# Patient Record
Sex: Female | Born: 1975 | Race: White | Hispanic: No | Marital: Married | Smoking: Never smoker
Health system: Southern US, Community
[De-identification: ages and names within clinical notes are randomized; demographics above are authoritative.]

## PROBLEM LIST (undated history)

## (undated) DIAGNOSIS — E785 Hyperlipidemia, unspecified: Secondary | ICD-10-CM

## (undated) DIAGNOSIS — I10 Essential (primary) hypertension: Secondary | ICD-10-CM

## (undated) HISTORY — DX: Essential (primary) hypertension: I10

## (undated) HISTORY — PX: ROTATOR CUFF REPAIR: SHX139

## (undated) HISTORY — PX: LAPAROSCOPY: SHX197

## (undated) HISTORY — DX: Hyperlipidemia, unspecified: E78.5

## (undated) HISTORY — PX: NECK SURGERY: SHX720

---

## 1998-01-04 ENCOUNTER — Emergency Department (HOSPITAL_COMMUNITY): Admission: EM | Admit: 1998-01-04 | Discharge: 1998-01-04 | Payer: Self-pay | Admitting: Emergency Medicine

## 1998-11-12 ENCOUNTER — Emergency Department (HOSPITAL_COMMUNITY): Admission: EM | Admit: 1998-11-12 | Discharge: 1998-11-12 | Payer: Self-pay | Admitting: Emergency Medicine

## 2000-12-04 ENCOUNTER — Ambulatory Visit (HOSPITAL_COMMUNITY): Admission: RE | Admit: 2000-12-04 | Discharge: 2000-12-04 | Payer: Self-pay | Admitting: Obstetrics and Gynecology

## 2000-12-04 ENCOUNTER — Encounter (INDEPENDENT_AMBULATORY_CARE_PROVIDER_SITE_OTHER): Payer: Self-pay

## 2002-09-26 ENCOUNTER — Other Ambulatory Visit: Admission: RE | Admit: 2002-09-26 | Discharge: 2002-09-26 | Payer: Self-pay | Admitting: Obstetrics and Gynecology

## 2003-09-27 ENCOUNTER — Other Ambulatory Visit: Admission: RE | Admit: 2003-09-27 | Discharge: 2003-09-27 | Payer: Self-pay | Admitting: Obstetrics and Gynecology

## 2004-10-14 ENCOUNTER — Ambulatory Visit (HOSPITAL_COMMUNITY): Admission: RE | Admit: 2004-10-14 | Discharge: 2004-10-14 | Payer: Self-pay | Admitting: Obstetrics and Gynecology

## 2004-10-14 ENCOUNTER — Encounter (INDEPENDENT_AMBULATORY_CARE_PROVIDER_SITE_OTHER): Payer: Self-pay | Admitting: Specialist

## 2006-01-20 ENCOUNTER — Emergency Department (HOSPITAL_COMMUNITY): Admission: EM | Admit: 2006-01-20 | Discharge: 2006-01-20 | Payer: Self-pay | Admitting: Emergency Medicine

## 2007-10-26 ENCOUNTER — Ambulatory Visit: Payer: Self-pay | Admitting: Vascular Surgery

## 2008-09-19 ENCOUNTER — Ambulatory Visit (HOSPITAL_COMMUNITY): Admission: RE | Admit: 2008-09-19 | Discharge: 2008-09-19 | Payer: Self-pay | Admitting: Family Medicine

## 2009-01-15 ENCOUNTER — Ambulatory Visit (HOSPITAL_COMMUNITY): Admission: RE | Admit: 2009-01-15 | Discharge: 2009-01-15 | Payer: Self-pay | Admitting: Obstetrics

## 2010-04-18 ENCOUNTER — Encounter (HOSPITAL_COMMUNITY)
Admission: RE | Admit: 2010-04-18 | Discharge: 2010-04-18 | Disposition: A | Discharge status: Home / Self Care | Payer: BC Managed Care – PPO | Source: Ambulatory Visit | Attending: Obstetrics | Admitting: Obstetrics

## 2010-04-18 DIAGNOSIS — Z01812 Encounter for preprocedural laboratory examination: Secondary | ICD-10-CM | POA: Insufficient documentation

## 2010-04-18 LAB — URINALYSIS, ROUTINE W REFLEX MICROSCOPIC
Bilirubin Urine: NEGATIVE
Ketones, ur: NEGATIVE mg/dL
Nitrite: NEGATIVE
Specific Gravity, Urine: <1.005 — ABNORMAL LOW (ref 1.005–1.030)
Urobilinogen, UA: 0.2 mg/dL (ref 0.0–1.0)

## 2010-04-18 LAB — RPR: RPR Ser Ql: NONREACTIVE

## 2010-04-18 LAB — CBC
HCT: 39.3 % (ref 36.0–46.0)
Hemoglobin: 12.7 g/dL (ref 12.0–15.0)
MCH: 29 pg (ref 26.0–34.0)
MCHC: 32.3 g/dL (ref 30.0–36.0)
MCV: 89.7 fL (ref 78.0–100.0)
Platelets: 196 10*3/uL (ref 150–400)
RBC: 4.38 MIL/uL (ref 3.87–5.11)
RDW: 13.3 % (ref 11.5–15.5)
WBC: 7.5 10*3/uL (ref 4.0–10.5)

## 2010-04-18 LAB — URINE MICROSCOPIC-ADD ON

## 2010-04-25 ENCOUNTER — Inpatient Hospital Stay (HOSPITAL_COMMUNITY)
Admission: RE | Admit: 2010-04-25 | Discharge: 2010-04-28 | DRG: 371 | Disposition: A | Discharge status: Home / Self Care | Payer: BC Managed Care – PPO | Source: Ambulatory Visit | Attending: Obstetrics | Admitting: Obstetrics

## 2010-04-25 DIAGNOSIS — O09519 Supervision of elderly primigravida, unspecified trimester: Secondary | ICD-10-CM | POA: Diagnosis present

## 2010-04-25 DIAGNOSIS — O321XX0 Maternal care for breech presentation, not applicable or unspecified: Principal | ICD-10-CM | POA: Diagnosis present

## 2010-04-25 LAB — ABO/RH: ABO/RH(D): A NEG

## 2010-04-25 LAB — TYPE AND SCREEN: ABO/RH(D): A NEG

## 2010-04-26 LAB — CBC
MCHC: 32.2 g/dL (ref 30.0–36.0)
RDW: 13.4 % (ref 11.5–15.5)

## 2010-04-30 ENCOUNTER — Inpatient Hospital Stay (HOSPITAL_COMMUNITY)
Admission: AD | Admit: 2010-04-30 | Discharge: 2010-04-30 | Disposition: A | Discharge status: Home / Self Care | Payer: BC Managed Care – PPO | Source: Ambulatory Visit | Attending: Obstetrics & Gynecology | Admitting: Obstetrics & Gynecology

## 2010-04-30 DIAGNOSIS — O139 Gestational [pregnancy-induced] hypertension without significant proteinuria, unspecified trimester: Secondary | ICD-10-CM | POA: Insufficient documentation

## 2010-04-30 LAB — COMPREHENSIVE METABOLIC PANEL
Alkaline Phosphatase: 117 U/L (ref 39–117)
BUN: 10 mg/dL (ref 6–23)
GFR calc non Af Amer: >60 mL/min (ref >60)
Glucose, Bld: 95 mg/dL (ref 70–99)
Potassium: 4 mEq/L (ref 3.5–5.1)
Total Protein: 6.9 g/dL (ref 6.0–8.3)

## 2010-04-30 LAB — URINALYSIS, ROUTINE W REFLEX MICROSCOPIC
Nitrite: NEGATIVE
Specific Gravity, Urine: <1.005 — ABNORMAL LOW (ref 1.005–1.030)
pH: 6 (ref 5.0–8.0)

## 2010-04-30 LAB — URIC ACID: Uric Acid, Serum: 4.6 mg/dL (ref 2.4–7.0)

## 2010-04-30 LAB — CBC
Platelets: 242 10*3/uL (ref 150–400)
RBC: 3.97 MIL/uL (ref 3.87–5.11)
WBC: 7.4 10*3/uL (ref 4.0–10.5)

## 2010-04-30 LAB — URINE MICROSCOPIC-ADD ON

## 2010-04-30 LAB — LACTATE DEHYDROGENASE: LDH: 194 U/L (ref 94–250)

## 2010-05-02 ENCOUNTER — Inpatient Hospital Stay (HOSPITAL_COMMUNITY): Admission: AD | Admit: 2010-05-02 | Payer: Self-pay | Admitting: Obstetrics

## 2010-05-10 NOTE — Op Note (Signed)
Veronica Armstrong, Veronica Armstrong                ACCOUNT NO.:  [id number]  MEDICAL RECORD NO.:  [id number]           PATIENT TYPE:  I  LOCATION:  9145                          FACILITY:  [facility]  PHYSICIAN:  Lendon Colonel, MD   DATE OF BIRTH:  08-Jun-1975  DATE OF PROCEDURE:  04/25/2010 DATE OF DISCHARGE:  04/18/2010                              OPERATIVE REPORT   PREOPERATIVE DIAGNOSES: 1. Persistent breech presentation. 2. 39 weeks' intrauterine pregnancy.  POSTOPERATIVE DIAGNOSES: 1. Persistent breech presentation. 2. 39 weeks' intrauterine pregnancy.  PROCEDURE:  Primary low transverse cesarean section with 2-layer closure.  SURGEON:  Alphonsus Sias. Ernestina Penna, MD  ASSISTANT:  Lenoard Aden, MD  ANESTHESIA:  Spinal.  FINDINGS:  Baby girl in the complete breech presentation, left sacrum transverse.  Apgars 9 and 9, weight 7 pounds 0 ounces.  Normal uterus, tubes, and ovaries.  Normal placenta.  Clear amniotic fluid, Apgars 9 and 9.ESTIMATED BLOOD LOSS:  500 mL.  ANTIBIOTICS:  1 g of Ancef.  COMPLICATIONS:  None.  INDICATIONS:  This a 35 year old G1 at 5 weeks and 0 days' gestation with persistent breech presentation, who declined trial of version, presents for primary cesarean section.  PROCEDURE:  After informed consent was obtained, the patient was taken to the operating room where spinal anesthesia was administered without difficulty.  She was prepped and draped in the normal sterile fashion in dorsal supine with leftward tilt.  A Pfannenstiel skin incision was made 2 cm above the pubic symphysis in midline, carried through to the underlying layer of fascia with the Bovie cautery.  The fascia was incised in the midline and the incision extended sharply.  The inferior aspect of fascial incision was grasped with the Kocher clamps, elevated up, and the underlying rectus muscles dissected off sharply.  The superior aspect of the fascial incision was grasped with the Kocher clamps,  elevated up, and the underlying rectus muscles dissected off with the Bovie.  The pyramidalis muscles were separated with the Bovie cautery in the midline.  The rectus muscles were bluntly separated.  The peritoneum was identified and entered bluntly.  Peritoneal incision was extended superiorly and inferiorly with good visualization of the bladder.  Palpation of the abdomen confirmed fetal position, and the ultrasound will be done perioperatively.  The bladder was inserted.  The vesicouterine peritoneum was identified, grasped with pickups, entered sharply, extended laterally, and the bladder flap was created digitally. The bladder blade was reinserted.  The lower uterine segment was incised in a transverse fashion with a scalpel.  The uterine incision was extended with the bandage scissors.  The infant's sacrum was grasped, brought through the incision.  The left and right legs were delivered. The right shoulder was delivered.  The baby was rotated.  The left shoulder and arm was delivered and the head was delivered in flexion. Bulb suctioning was done.  The cord was clamped and cut.  Infant was handed off to awaiting pediatrician.  The placenta was expressed.  Cord blood was obtained.  The uterus was exteriorized and cleared of all clots and debris.  Uterine incision was repaired with  an 0 Vicryl in a running locked fashion.  A second layer of same suture was used in imbricating fashion to obtain good hemostasis.  The uterus was returned to the abdomen.  The incision was reinspected.  The tagged angles were cut.  The bladder flap was inspected.  Several capillary bleeders were noted and Bovie cautery was used for hemostasis.  The gutters were cleared of all clots and debris.  Incision was reinspected and found to be hemostatic.  The peritoneum was closed with a 2-0 Vicryl in a running fashion.  The cut muscle edges on the underside of fascia were inspected, found to be hemostatic.  The  fascia was closed with an 0 Vicryl in a running fashion in a single layer.  The single stitch was placed to reapproximate the rectus muscles over the bladder prior to closure of the fascia.  2-0 plain suture was used to reapproximate Scarpa's layer and the skin was closed with staples.  The patient tolerated the procedure well.  Sponge, lap, and needle counts were correct x3 and the patient was taken to recovery room in stable condition.     Lendon Colonel, MD     KAF/MEDQ  D:  04/25/2010  T:  04/26/2010  Job:  [id number]  Electronically Signed by Noland Fordyce MD on 05/09/2010 07:56:32 PM

## 2010-05-10 NOTE — Discharge Summary (Signed)
  NAMEJENNAVECIA, Veronica Armstrong                ACCOUNT NO.:  [id number]  MEDICAL RECORD NO.:  [id number]           PATIENT TYPE:  I  LOCATION:  9145                          [facility]ILITY:  [facility]  PHYSICIAN:  Lendon Colonel, MD   DATE OF BIRTH:  [birth year]  DATE OF ADMISSION:  04/25/2010 DATE OF DISCHARGE:  04/28/2010                              DISCHARGE SUMMARY   ADMITTING DIAGNOSIS:  62 weeks' gestation with breech presentation.  DISCHARGE DIAGNOSIS:  Postoperative day #3, status post cesarean section.  PATIENT HISTORY:  The patient is a 35 year old gravida 1, para 0 at 34 weeks' gestation.  Prenatal care has been at [facility] since 6 weeks' gestation with Dr. Ernestina Penna as primary.  MEDICAL HISTORY:  History of Raynaud, depression, anxiety, and allergic rhinitis.  ALLERGIES:  The patient has known allergies to ERYTHROMYCIN, PHENERGAN, VICODIN, and LATEX.  CURRENT MEDICATIONS AT TIME OF ADMISSION: 1. Flonase. 2. Zantac 150 mg p.o. daily. 3. Claritin 10 mg p.o. daily. 4. Wellbutrin 150 mg p.o. daily.  SURGICAL HISTORY:  Significant for laparoscopy x2.  PRENATAL LABS:  Blood type and Rh is A negative, rubella positive, HIV negative, RPR negative, hepatitis B negative, GTT screening within normal limits, negative gonorrhea, and negative Chlamydia, history of a positive ANA titer.  PRESENTATION:  The patient presents for scheduled cesarean section for breech presentation.  Preop CBC, white blood cell count 7.5, hemoglobin 12.7, hematocrit 39.3, and platelet count of 196.  Postop care, the patient's postoperative course was uneventful.  The patient has labile blood pressures with increased pain related to inadequate pain control. On postoperative day #1-1/2 to 2, pain was well controlled on postoperative day #2 with 2 Percocet p.o. q.4 h.  Physical exam was within normal limits.  Vital signs were stable.  The patient remains afebrile during the course of hospitalization.   Blood pressures 126-160/75-96, negative output, 785 mL.  PROCEDURE:  The patient delivered a female by cesarean section with Dr. Ernestina Penna, 7 pounds 0 ounces, Apgar scores of 9 and 9, see operative report for further details.  DISCHARGE INSTRUCTIONS:  The patient discharged home on postoperative day #3 in stable condition.  The patient returned to [facility] within 2 days for blood pressure recheck and staple removal.  MEDICATIONS AT TIME OF DISCHARGE: 1. Prenatal vitamin 1 tablet p.o. daily. 2. Flonase. 3. Zantac 150 mg p.o. daily. 4. Claritin 10 mg p.o. daily. 5. Wellbutrin 150 mg p.o. daily. 6. Ibuprofen 800 mg every 8 hours as needed for discomfort. 7. Percocet 2 tablets every 4 hours as needed for pain.     Marlinda Mike, C.N.M.   ______________________________ Lendon Colonel, MD    TB/MEDQ  D:  04/28/2010  T:  04/29/2010  Job:  [id number]  Electronically Signed by Marlinda Mike C.N.M. on 05/06/2010 12:32:50 PM Electronically Signed by Noland Fordyce MD on 05/09/2010 07:56:56 PM

## 2010-07-19 NOTE — H&P (Signed)
Veronica Armstrong, Veronica Armstrong                ACCOUNT NO.:  [id number]   MEDICAL RECORD NO.:  [id number]          PATIENT TYPE:  AMB   LOCATION:  SDC                           [facility]ILITY:  [facility]   PHYSICIAN:  Richardean Sale, M.D.   DATE OF BIRTH:  December 05, 1975   DATE OF ADMISSION:  10/14/2004  DATE OF DISCHARGE:                                HISTORY & PHYSICAL   PREOPERATIVE DIAGNOSIS:  1.  Pelvic pain.  2.  Endometriosis.  3.  Infertility.   HISTORY OF PRESENT ILLNESS:  This is a 35 year old gravida 0, white female  who is having progressively worsening pelvic pain, dysmenorrhea, and  dyspareunia.  The patient has a known history of endometriosis diagnosed on  laparoscopy in October of 2002.  The patient had significant improvement in  her symptoms immediately following her surgery.  Attempts were made to  control her pain with oral contraceptive pills, but were unsuccessful due to  side effects.  The patient had a Mirena IUD placed and had significant  improvement in her symptoms.  She discontinued this method one year ago in  attempts to achieve her pregnancy.  The patient and her husband have been  unsuccessful in achieving pregnancy in the last year and the patient's  symptoms have worsened substantially.  She presents today for laparoscopy  with removal and/or ablation of endometriosis and placement of a Mirena  intrauterine system.   PAST MEDICAL HISTORY:  Depression, Raynaud's phenomenon.   PAST SURGICAL HISTORY:  Laparoscopy in October of 2002 for endometriosis.   PAST OBSTETRICAL HISTORY:  Gravida 0.   GYNECOLOGIC HISTORY:  No abnormal Pap smears or STD's, positive for  endometriosis.   FAMILY HISTORY:  Father has hypertension, coronary artery disease, and  Parkinson's.  No known breast, uterine, ovarian, or colon cancer.   SOCIAL HISTORY:  She is married.  Denies tobacco or illicit drug use.  Drinks alcohol only socially.  Works for [employer].  Husband works for  the Programmer, systems.   MEDICATIONS:  1.  Advil p.r.n.  2.  Prenatal vitamins.  3.  Differin gel.   ALLERGIES:  PHENERGAN, ERYTHROMYCIN.   PHYSICAL EXAMINATION:  VITAL SIGNS:  Height 5 feet 2-3/4 inches, weight 133  pounds, blood pressure 122/80.  GENERAL:  She is a well-developed, well-nourished, white female who is in no  acute distress.  NECK:  Neck and thyroid are within normal limits without masses.  HEART:  Regular rate and rhythm.  LUNGS: Clear to auscultation bilaterally.  ABDOMEN:  Soft, nontender, and nondistended with no palpable masses.  The  liver and spleen are normal.  BREASTS:  Symmetric and nontender.  No palpable masses.  No lymphadenopathy.  No nipple discharge.  No skin changes.  PELVIC:  External genitalia are normal.  Vagina is pink, moist, and rugated.  Cervix is visibly normal without lesions.  No cervical motion tenderness.  Bimanual examination; the uterus is difficult to palpate, the patient is  moderately tender to palpation on both lower quadrants, the left greater  than right. The adnexa are difficult to palpate due to discomfort.  Urethra,  urethral meatus, bladder, and anus are all within normal limits.  Ultrasound  performed on October 03, 2004, shows a normal size uterus with a normal  appearing endometrium.  Both ovaries appear within normal limits without  masses. Last Pap smear in July of 2006 was within normal limits.   ASSESSMENT:  A 35 year old gravida 0, white female with significant pelvic  pain, dysmenorrhea, and dyspareunia with a known history of endometriosis,  now with recurrent symptoms, who no longer desires attempt at pregnancy.   PLAN:  1.  Will proceed with diagnostic laparoscopy and with removal and/or      ablation of endometriosis if endometriosis identified.  2.  Will also perform chromopertubation given the patient's history of      endometriosis and desire for pregnancy in the future.  3.  Given the patient's desire for  contraception at this time as well as      control of her endometriosis symptoms, will place Mirena intrauterine      system as well.   The risks, benefits, and alternatives of these procedures have been reviewed  with the patient in detail.  We have discussed the risks of hemorrhage,  infection, injury to the bowel, bladder, ureters, ovaries, tubes, or the  uterus which may require additional surgery either at the time of this  procedure or in the future.  We discussed the risks of anesthesia including  deep venous thrombosis, pulmonary embolism, and discussed the risks of  uterine perforation with the Mirena IUD which would require surgical removal  either at the time of this procedure or in the future.  We also discussed  general surgical risks.  The patient voices understanding of all of these  risks and desires to proceed.  Informed consent has been obtained and all  questions have been answered in detail.       JW/MEDQ  D:  10/13/2004  T:  10/13/2004  Job:  [id number]

## 2010-07-19 NOTE — H&P (Signed)
[facility]  Patient:    Veronica, Armstrong Visit Number: [id number] MRN: [id number]          Service Type: Attending:  Janine Armstrong, M.D. Dictated by:   Veronica Armstrong, M.D. Adm. Date:  12/04/00   CC:         Veronica Armstrong, M.D.   History and Physical  DATE OF BIRTH:                09-Jan-1976.  HISTORY OF PRESENT ILLNESS:   Veronica Armstrong is a 35 year old female, gravida 0, who presents for diagnostic laparoscopy because of pelvic pain and dyspareunia.  She also complains of painful bowel movements with her menses. The patient has had a negative gonorrhea and Chlamydia culture.  Her urine culture was negative.  An ultrasound demonstrated normal female organs.  Her Pap smear was within normal limits.  The patient currently takes birth control pills and this has not relieved her discomfort.  She is ready to proceed with diagnostic surgery.  DRUG ALLERGIES:               PHENERGAN and ERYTHROMYCIN.  PAST MEDICAL HISTORY:         The patient has seasonal allergies and she takes Careers adviser.  She also takes Prozac 20 mg each day for migraine headaches.  She uses Midrin as needed.  She takes Yasmin oral contraceptives.  SOCIAL HISTORY:               The patient is married and she works as a Diplomatic Services operational officer at [employer].  She denies cigarette use, alcohol use, and recreational drug use.  REVIEW OF SYSTEMS:            Please see history of present illness.  FAMILY HISTORY:               The patients father has heart disease and Parkinsons disease.  Her paternal grandmother has leukemia.  PHYSICAL EXAMINATION:  VITAL SIGNS:                  Weight 137 pounds.  HEENT:                        Within normal limits.  CHEST:                        Clear.  HEART:                        Regular rate and rhythm.  BREASTS:                      Without masses.  ABDOMEN:                      Not tender.  EXTREMITIES:                  Within  normal limits.  NEUROLOGIC EXAM:              Within normal limits.  PELVIC EXAM:                  External genitalia is normal.  The vagina is normal.  Cervix is nontender.  Uterus is normal size, shape, and consistency. Adnexa with no masses.  Rectovaginal exam confirms.  ASSESSMENT:  1.  Pelvic pain.                               2.  Dyspareunia.                               3.  Painful bowel movements.  PLAN:                         The patient will undergo a diagnostic laparoscopy and chromopertubation.  The patient understands the indications for her procedure and she accepts the risks of, but limited to, anesthetic complications, bleeding, infection, and possible damage to the surrounding organs. Dictated by:   Veronica Armstrong, M.D. Attending:  Janine Armstrong, M.D. DD:  12/03/00 TD:  12/03/00 Job: [id number]

## 2010-07-19 NOTE — Op Note (Signed)
NAMESYNAI, PRETTYMAN                ACCOUNT NO.:  [id number]   MEDICAL RECORD NO.:  [id number]          PATIENT TYPE:  AMB   LOCATION:  SDC                           FACILITY:  [facility]   PHYSICIAN:  Richardean Sale, M.D.   DATE OF BIRTH:  Dec 28, 1975   DATE OF PROCEDURE:  10/14/2004  DATE OF DISCHARGE:                                 OPERATIVE REPORT   PREOPERATIVE DIAGNOSES:  1.  Pelvic pain.  2.  Endometriosis.  3.  Infertility.   POSTOPERATIVE DIAGNOSES:  1.  Pelvic pain.  2.  Endometriosis.  3.  Infertility.  4.  Hemorrhagic left ovarian cyst.   PROCEDURE:  Laparoscopy with ablation of endometriosis and aspiration of  hemorrhagic ovarian cyst and placement of Mirena IUD.   SURGEON:  Dr. Richardean Sale   ASSISTANT:  None.   ANESTHESIA:  General.   COMPLICATIONS:  None.   ESTIMATED BLOOD LOSS:  Minimal.   SPECIMENS:  Hemorrhagic cyst the fluid to pathology.   FINDINGS:  Hemorrhagic left ovarian cyst approximately 3.5 cm in maximum  diameter.  Normal-appearing fallopian tubes bilaterally.  Right ovary  grossly normal with endometriosis implants on the posterior aspect.  Diffuse  endometriosis involving the anterior cul-de-sac, posterior cul-de-sac. both  ovaries, and the sigmoid colon and rectum.  Normal-appearing appendix;  normal liver, gallbladder, normal-appearing uterus.  Uterine sound length  8.5 cm.   INDICATIONS:  This is a 35 year old, gravida 0 white female, who has known  endometriosis documented on laparoscopy in 2002, who presents with a 1-year  history of progressively worsening pelvic pain, dysmenorrhea, and  dyspareunia.  The patient initially underwent ablation of endometriosis in  2002 followed by attempts at oral contraceptive pills.  The patient was  unable to tolerate these due the side effects.  She declined treatment with  Depot Lupron.  She has subsequently underwent insertion of a Mirena IUD and  had significant pain relief after her surgery and  after placement of the  IUD.  The IUD was discontinued 1 year ago, and the patient and her husband  have been trying to conceive without success.  The patient's pain has  progressed to the point where she no longer desires attempts at childbearing  at present, and her pain is debilitating to her, particularly during menses.  In the past week, she has had progressively worsening left lower quadrant  pain.  She presents today for diagnostic laparoscopy, possible ablation  and/or excision of endometriosis, and placement of a Mirena IUD.  Prior to  the procedure, the risks, benefits, and alternatives of all procedures were  reviewed with the patient in detail.  We discussed the risk of hemorrhage,  transfusion, infection, injury to the bowel, the bladder, the ureters, the  ovaries, the tubes, the uterus which could require additional surgery either  at the time of this procedure or in the future.  We also discussed the risk  of anesthesia and DVT, and we discussed the risks of uterine perforation  during placement of the Mirena IUD insertion and the possibility of having  to have the IUD removed surgically  at some time in the future.  The patient  voiced understanding of all these risks prior to the procedure, and informed  consent was obtained.   PROCEDURE:  The patient was taken to the operating room where she was given  a general anesthetic.  She was then prepped and draped in the usual sterile  fashion with Betadine.  A bimanual exam was performed which revealed a  normal-sized uterus.  It was anteverted and mobile.  The right adnexa was  normal.  The left adnexa felt slightly enlarged.  There was no cul-de-sac  nodularity noted.  A Foley catheter was then used to drain the bladder.  A  speculum was placed in the vagina, and the cervix was grasped with single-  tooth tenaculum.  The Hulka tenaculum was then introduced and the single-  tooth tenaculum removed.  The speculum was removed as  well.  At this point  attention was turned to the patient's abdomen.  After sterile gown and glove  were changed, 5 mL of 0.5% plain Marcaine were injected into the  infraumbilical area, and a linear incision was made with the scalpel through  the patient's previous scar.  This was carried down sharply to the fascia.  The fascia was then grasped between two Kocher clamps and entered sharply  with the Mayo scissors.  The peritoneum was then identified, grasped with a  hemostat, elevated, and entered sharply.  The fascial incision was then  secured with a pursestring Vicryl suture, and the Hassan trocar and sleeve  were introduced.  Intraabdominal placement was confirmed with the  laparoscope, and CO2 gas was allowed to insufflate.  At this point, a second  incision was made in the patient's left lower quadrant.  This was a 5 mm  incision, and 2 mL of 0.5% plain Marcaine were injected prior to the  incision.  The left 5 mm port trocar and sleeve were introduced under direct  insertion, and an atraumatic grasper was then introduced into the pelvis.  The pelvis was inspected with the findings noted above, particularly there  was a left ovarian cyst present that was not seen on the patient's  ultrasound, approximately 2-1/2 weeks ago.  This appeared hemorrhagic.  However, it was adherent to the sidewall, but these adhesions were taken  down with just blunt dissection and manipulation of the cyst.  Both  fallopian tubes appeared normal with no clubbing.  There was significant  involvement of endometriosis along the rectosigmoid; however, it did not  appear as though the lumen was at all compromised.  There were endometrial  implants over the patient's left ureter, along both uterosacral ligaments,  and about the posterior cul-de-sac.  There were scant implants noted in the  anterior cul-de-sac, and the majority of the disease was present at the posterior cul-de-sac.  At this point, a third  incision was made.  Again, 2  mL of 0.5% plain Marcaine were injected in the patient's right lower  quadrant.  The 5 mm skin incision was then made, and the trocar and sleeve  were introduced under direct visualization free from the epigastric vessels.  At this point, the ovarian cyst was stabilized.  A needle aspirator was then  inserted directly into the cyst, and hemorrhagic fluid was removed.  This  was sent to pathology labeled as cyst contents.  There was no chocolate  fluid noted and no excrescences or papillation noted along the inside of the  ovary.  Again, the cyst was  consistent with a hemorrhage cyst.  At this  point, attention was then turned to the endometrial implants.  The bipolar  cautery was used to cauterize these implants, and as many implants as could  be safely cauterized were treated in this fashion.  The right ureter was  noted to be coursing and peristalsing normally.  There were no implants over  the patient's right ureter.  On the left side, however, there was  involvement of the ureter, and this implant was not removed given its  location.  On inspection of the sigmoid colon, the implants did not appear  to be impinging upon the lumen and given that the patient had not had any  type of bowel preparation and was not prepared for any bowel surgery should  it occur, the two implants on the sigmoid colon were not cauterized.  At  this point, inspection of the pelvis revealed that all areas were  hemostatic.  The pelvis was then copiously irrigated with warm normal  saline, and the irrigant fluid was aspirated.  At this point, 5 mL of 0.5%  plain Marcaine were inserted into the pelvis and placed over the ablated  endometrial implant for additional postoperative relief.  At this point, the  laparoscopic portion of the procedure was terminated.  All instruments were  removed.  The 5 mm trocars were removed under direct visualization.  There  was no bleeding from the  trocar site.  The camera was then removed and CO2  gas was allowed to escape.  The Hasson trocar was removed.  The fascial  incision was visualized.  There was no evidence of any bowel looped up into  the fascial incision, and the fascia was then closed by tying the previously  placed pursestring suture.  The skin incisions were then all closed with 4-0  Vicryl in a subcuticular fashion, and Steri-Strips were applied.  At this  point, attention was then turned to the placement of the Mirena.  The Hulka  tenaculum was removed.  A speculum was placed.  The cervix had already been  prepped sterilely with Betadine.  A single-tooth tenaculum was then used to  grasp the anterior lip of the cervix and the uterus sounded to 8.5 cm.  A  Hegar dilator had to be used to dilate the internal cervical os.  Once this  was accomplished, the Mirena IUD was inserted without difficulty.  The  strings were trimmed to 2.5 cm in length, and the single-tooth tenaculum was removed.  The speculum was then removed.  The patient tolerated all  procedures well with no complications.  She was taken out of the dorsal  lithotomy position and was awakened from her anesthesia and was transferred  to the recovery room awake and in stable condition.  There were no  complications.      Richardean Sale, M.D.  Electronically Signed     JW/[id number]  D:  10/14/2004  T:  10/14/2004  Job:  [id number]

## 2010-07-19 NOTE — Op Note (Signed)
[facility]  Patient:    Veronica Armstrong, Veronica Armstrong Visit Number: [id number] MRN: [id number]          Service Type: DSU Location: [facility] Attending Physician:  Leonard Schwartz Dictated by:   Janine Limbo, M.D. Proc. Date: 12/04/00 Admit Date:  12/04/2000 Discharge Date: 12/04/2000                             Operative Report  PREOPERATIVE DIAGNOSES:       1. Pelvic pain.                               2. Dyspareunia.                               3. Painful bowel movements.  POSTOPERATIVE DIAGNOSES:      1. Pelvic pain.                               2. Dyspareunia.                               3. Painful bowel movements.                               4. Endometriosis.  PROCEDURE:                    1. Diagnostic laparoscopy.                               2. Laparoscopic pelvic biopsies.                               3. Laparoscopic ablation of endometriosis.                               4. Laparoscopic resection of endometriosis.                               5. Chromopertubation.  SURGEON:                      Janine Limbo, M.D.  ANESTHESIA:                   General.  DISPOSITION:                  Veronica Armstrong is a 35 year old female, gravida 0, who presents with the above-mentioned diagnosis. The patient understands the indications for her procedure and she accepts the risk of, but not limited to, anesthetic complications, bleeding, infections, and possible damage to the surrounding organs.  FINDINGS;                     The uterus, fallopian tubes, and ovaries were grossly normal. There was extensive endometriosis noted in the pelvis. There were 0.5 to 1 cm implants of endometriosis noted in the posterior cul-de-sac in the midline. There were  0.5 cm implants of endometriosis in the anterior cul-de-sac. There were 0.5 to 1.0 cm implants of endometriosis on the pelvic sidewalls and on the ovaries. There were 0.5 cm implants of  endometriosis on the bowel as it exited toward the rectum in the posterior cul-de-sac. The appendix appeared normal. The liver appeared normal. There were 0.5 cm implants of endometriosis present on the anterior peritoneum lateral to the large intestine at the level of the appendix. On chromopertubation, the fallopian tubes quickly filled bilaterally and dye quickly spilled from delicate fimbriated ends.  DESCRIPTION OF PROCEDURE:     The patient was taken to the operating room where a general anesthetic was given. The patients abdomen, perineum, and vagina were prepped with multiple layers of Betadine. A Foley catheter was placed in the bladder. Examination under anesthesia was performed. An acorn cannula was placed inside the cervix. The patient was sterilely draped. The subumbilical area was injected with 5 cc of 0.5% Marcaine. An incision was made and the Veress needle was inserted. Proper placement was confirmed using the saline drop test. A pneumoperitoneum was then obtained. The laparoscopic trocar and then the laparoscope were substituted for the Veress needle. The pelvic structures were visualized with findings as mentioned above. Two suprapubic areas were injected with 2.5 cc of 0.5% Marcaine each and two incisions were made in the suprapubic area. Five millimeter trocars were placed inside the abdominal cavity under direct visualization. Pictures were taken of the patients pelvic anatomy. There were no signs of adhesion formation. We then began to resect areas of endometriosis from the posterior cul-de-sac by first injecting the peritoneum with saline and then sharply excising the areas. Care was taken not to damage the vital structures. The ureters were identified bilaterally and followed throughout their course. The endometriosis in the anterior cul-de-sac was then resected. Biopsies were taken of some of the small implants. We then used the harmonic scalpel to ablate areas  of endometriosis. Again, care was taken not to damage the vital structures. The endometriosis on the rectum was not resected. After we felt we had safely taken care of the endometriosis that we could, we then vigorously irrigated the pelvic cavity. Blue dye was then injected into the acorn cannula and the fallopian tubes quickly filled bilaterally and the dye quickly spilled from the fimbriated ends. The patient tolerated her procedure well. The fluid was then aspirated from the posterior cul-de-sac. The bowel was then carefully inspected and there was no evidence of trocar damage. All instruments were removed. Sponge, needle, and instrument counts were correct. The incisions were closed using deep and superficial sutures of 4-0 Vicryl. The estimated blood loss was 20 cc. The patient tolerated her procedure well. The patients anesthetic was reversed and the patient was taken to the recovery room in stable condition.  FOLLOWUP INSTRUCTIONS:        The patient was given a copy of the postoperative instruction sheet as prepared by the [facility] for patients who have undergone laparoscopy. The patient was given a prescription for Vicodin during her preoperative evaluation and she has plenty of medication at home. The patient will return to see Dr. Stefano Gaul in two to three weeks for a followup examination. We will plan Lupron Depot for further management if the patient agrees. Dictated by:   Janine Limbo, M.D. Attending Physician:  Leonard Schwartz DD:  12/12/00 TD:  12/13/00 Job: [phone]

## 2015-01-29 ENCOUNTER — Ambulatory Visit (INDEPENDENT_AMBULATORY_CARE_PROVIDER_SITE_OTHER): Payer: BLUE CROSS/BLUE SHIELD | Admitting: Family Medicine

## 2015-01-29 ENCOUNTER — Encounter: Payer: Self-pay | Admitting: Family Medicine

## 2015-01-29 VITALS — BP 136/80 | HR 80 | Temp 98.2°F | Resp 16 | Ht 62.0 in | Wt 174.0 lb

## 2015-01-29 DIAGNOSIS — A09 Infectious gastroenteritis and colitis, unspecified: Secondary | ICD-10-CM

## 2015-01-29 DIAGNOSIS — T63481A Toxic effect of venom of other arthropod, accidental (unintentional), initial encounter: Secondary | ICD-10-CM

## 2015-01-29 DIAGNOSIS — W57XXXA Bitten or stung by nonvenomous insect and other nonvenomous arthropods, initial encounter: Secondary | ICD-10-CM

## 2015-01-29 MED ORDER — PREDNISONE 20 MG PO TABS: ORAL_TABLET | ORAL | Status: DC

## 2015-01-29 NOTE — Progress Notes (Signed)
Subjective:    Patient ID: Veronica Armstrong, female    DOB: 1975-06-14, 39 y.o.   MRN: [id number]  HPI Patient is a 39 year old white female who is actually a childhood friend of mine. Recently she went on vacation hiking in the mountains of [place].  She was taking acetazolamide for altitude sickness. 2 days prior to departure home, the patient developed severe profuse watery diarrhea. She states that she is unable to eat anything without having watery diarrhea. She denies any blood in her stool. She denies any abdominal pain. She states that she is having 8-10 bowel movements a day. She is maxed out on the dose of Imodium with no benefit. Therefore she started a prescription for Cipro 500 mg by mouth twice a day for 10 days was given to her by another physician. She just started this yesterday. She was given vaccinations for hepatitis A and typhoid prior to her travel. She denies any symptoms of malaria or yellow fever. However while hiking in the mountains, she was bitten numerous times by black flies. These are also the carriers of the filarial disease (onchocerciasis-which can cause river blindness).  Fortunately she does not have any large subcutaneous nodules. However she has numerous 3-4 mm erythematous papules with subcutaneous swelling.  there are more than 30 on her right calf, a similar number on her left calf, and 15-20 on her right and left forearms each. They are intensely pruritic. She is tried Benadryl and cortisone cream without relief.  No past medical history on file. No past surgical history on file. No current outpatient prescriptions on file prior to visit.   No current facility-administered medications on file prior to visit.   Allergies  Allergen Reactions  . Promethazine Other (See Comments)    Seizure  . Erythromycin Hives  . Hydrocodone Itching   Social History   Social History  . Marital Status: Married    Spouse Name: N/A  . Number of Children: N/A  . Years of  Education: N/A   Occupational History  . Not on file.   Social History Main Topics  . Smoking status: Never Smoker   . Smokeless tobacco: Not on file  . Alcohol Use: 0.0 oz/week    0 Standard drinks or equivalent per week     Comment: Social  . Drug Use: No  . Sexual Activity: Not on file   Other Topics Concern  . Not on file   Social History Narrative  . No narrative on file      Review of Systems  All other systems reviewed and are negative.      Objective:   Physical Exam  Constitutional: She appears well-developed and well-nourished.  Cardiovascular: Normal rate, regular rhythm and normal heart sounds.   Pulmonary/Chest: Effort normal and breath sounds normal. No respiratory distress. She has no wheezes. She has no rales.  Abdominal: Soft. Bowel sounds are normal. She exhibits no distension. There is no tenderness. There is no rebound.  Musculoskeletal: She exhibits no edema or tenderness.  Lymphadenopathy:    She has no cervical adenopathy.  Skin: Rash noted. There is erythema.  Vitals reviewed.         Assessment & Plan:  Insect bites and stings, accidental or unintentional, initial encounter - Plan: predniSONE (DELTASONE) 20 MG tablet  Traveler's diarrhea  I would like the patient to take Cipro for traveler's diarrhea. She can continue to take Imodium. This should resolve over the next 2-3 days. I recommended that  she push fluids to avoid dehydration. We discussed topical steroid creams for the treatment of her insect bites however she requests a prednisone taper pack. She is taking this before for other reasons without difficulty. She is aware that this week and her immune system at a critical time when she is trying to fight traveler's diarrhea but she is willing to take that risk due to severity of the itching and how miserable she is. She states that she will stop the prednisone immediately should she develop fever or should the diarrhea worsened. If she  starts to develop large subcutaneous nodules, I will treat the patient empirically with ivermectin for possible onchocerciasis.

## 2015-02-05 ENCOUNTER — Encounter: Payer: Self-pay | Admitting: Family Medicine

## 2015-02-05 DIAGNOSIS — A09 Infectious gastroenteritis and colitis, unspecified: Secondary | ICD-10-CM

## 2015-02-08 MED ORDER — DIPHENOXYLATE-ATROPINE 2.5-0.025 MG PO TABS: 2.0000 | ORAL_TABLET | Freq: Four times a day (QID) | ORAL | Status: DC | PRN

## 2015-02-08 NOTE — Telephone Encounter (Signed)
>','<<   Less Detail',event)" href="javascript:;">More Detail >>   FW: Visit Follow-Up Question    Donita BrooksWarren T Pickard, MD    Sent: Tue February 06, 2015 9:43 AM    To: Donne AnonKim M Savyon Loken, LPN        Message     Can we give her lomotil 2 tabs q 6 hrs prn diarrhea and have her come by for stool culture and stool O+P to rule out other potential infections such as giardia.    ----- Message -----     From: Ricard DillonSandy B Willis     Sent: 02/06/2015  7:58 AM      To: Donita BrooksWarren T Pickard, MD    Subject: FW: Visit Follow-Up Question                         ----- Message -----     From: Gar GibbonNicole J Sulkowski     Sent: 02/05/2015  4:54 PM      To: [facility]    Subject: Visit Follow-Up Question                   Good Afternoon,    I was in the office last Monday, November 28th. I had been out of the country and had developed traveler's diarrhea and had been bitten by bugs. The bug bites are 100% better, but the diarrhea is still lingering. It's been 9-10 days since the onset. It's improved some, but my appetite is zero and my stomach is just so upset still. Is there anything else I can take? I've finished up a round of Cipro and I'm taking Imodium for the diarrhea still. Any advice would be appreciated.         Thanks,    Kris HartmannNicole Dulac          [facility] Center[facility] Size     Small Medium Large Extra Extra Large    Gar Gibbonicole J Finks  02/05/2015  Patient Email  MRN:  [id number]   Description: 39 year old female  Provider: Donita BrooksWarren T Pickard, MD  Department: [facility]       Reason for Call                    Call Documentation     No notes of this type exist for this encounter.     Encounter MyChart Messages     Read Composed From To Subject    Y 02/05/2015 4:54 PM Elenora GammaNicole J Hagy Warren T Pickard, MD Visit Follow-Up Question      Created by     Donita BrooksWarren T Pickard, MD  on 02/05/2015 04:54 PM     Visit Pharmacy     [id number]- [place], [place] - [address]     Spoke to patient.  Says has started to feel some better.  Still has no appetite.  Told to come by a get stool collection vials to get samples for testing.  Rx for lomotil called to pharmacy to use as needed.

## 2015-03-15 ENCOUNTER — Ambulatory Visit
Admission: RE | Admit: 2015-03-15 | Discharge: 2015-03-15 | Disposition: A | Discharge status: Home / Self Care | Payer: BLUE CROSS/BLUE SHIELD | Source: Ambulatory Visit | Attending: Medical | Admitting: Medical

## 2015-03-15 ENCOUNTER — Other Ambulatory Visit: Payer: Self-pay | Admitting: Medical

## 2015-03-15 DIAGNOSIS — R52 Pain, unspecified: Secondary | ICD-10-CM

## 2015-03-15 DIAGNOSIS — M79662 Pain in left lower leg: Secondary | ICD-10-CM | POA: Insufficient documentation

## 2015-05-03 ENCOUNTER — Other Ambulatory Visit: Payer: Self-pay | Admitting: Neurology

## 2015-05-03 ENCOUNTER — Other Ambulatory Visit: Payer: Self-pay | Admitting: Medical

## 2015-05-04 ENCOUNTER — Other Ambulatory Visit: Payer: Self-pay | Admitting: Medical

## 2015-05-04 DIAGNOSIS — R200 Anesthesia of skin: Secondary | ICD-10-CM

## 2015-05-14 ENCOUNTER — Ambulatory Visit
Admission: RE | Admit: 2015-05-14 | Discharge: 2015-05-14 | Disposition: A | Discharge status: Home / Self Care | Payer: BLUE CROSS/BLUE SHIELD | Source: Ambulatory Visit | Attending: Medical | Admitting: Medical

## 2015-05-14 DIAGNOSIS — R200 Anesthesia of skin: Secondary | ICD-10-CM

## 2015-05-14 MED ORDER — GADOBENATE DIMEGLUMINE 529 MG/ML IV SOLN
15.0000 mL | Freq: Once | INTRAVENOUS | Status: AC | PRN
  Administered 2015-05-14: 15 mL via INTRAVENOUS

## 2015-06-07 DIAGNOSIS — R208 Other disturbances of skin sensation: Secondary | ICD-10-CM | POA: Diagnosis not present

## 2015-06-07 DIAGNOSIS — R200 Anesthesia of skin: Secondary | ICD-10-CM | POA: Diagnosis not present

## 2015-06-07 DIAGNOSIS — J0100 Acute maxillary sinusitis, unspecified: Secondary | ICD-10-CM | POA: Diagnosis not present

## 2015-06-25 ENCOUNTER — Ambulatory Visit (INDEPENDENT_AMBULATORY_CARE_PROVIDER_SITE_OTHER): Payer: BLUE CROSS/BLUE SHIELD | Admitting: Family Medicine

## 2015-06-25 ENCOUNTER — Encounter: Payer: Self-pay | Admitting: Family Medicine

## 2015-06-25 VITALS — BP 136/94 | HR 80 | Temp 98.1°F | Resp 14 | Wt 182.0 lb

## 2015-06-25 DIAGNOSIS — R509 Fever, unspecified: Secondary | ICD-10-CM | POA: Diagnosis not present

## 2015-06-25 DIAGNOSIS — J029 Acute pharyngitis, unspecified: Secondary | ICD-10-CM

## 2015-06-25 LAB — STREP GROUP A AG, W/REFLEX TO CULT: STREGTOCOCCUS GROUP A AG SCREEN: NOT DETECTED

## 2015-06-25 MED ORDER — AMOXICILLIN 875 MG PO TABS: 875.0000 mg | ORAL_TABLET | Freq: Two times a day (BID) | ORAL | Status: DC

## 2015-06-25 NOTE — Progress Notes (Signed)
   Subjective:    Patient ID: Veronica Armstrong, female    DOB: January 22, 1976, 40 y.o.   MRN: [id number]  HPI Symptoms began Saturday with head congestion fever to 100 and sore throat. She continues to have a severe sore throat even this morning although strep screen is negative. She continues to have head congestion and rhinorrhea. She reports very little cough. The sore throat is severe. No past medical history on file. No past surgical history on file. Current Outpatient Prescriptions on File Prior to Visit  Medication Sig Dispense Refill  . norethindrone-ethinyl estradiol (MICROGESTIN,JUNEL,LOESTRIN) 1-20 MG-MCG tablet TAKE 1 TABLET BY MOUTH DAILY. ACTIVE TABLETS ONLY  4   No current facility-administered medications on file prior to visit.   Allergies  Allergen Reactions  . Promethazine Other (See Comments)    Seizure  . Erythromycin Hives  . Hydrocodone Itching   Social History   Social History  . Marital Status: Married    Spouse Name: N/A  . Number of Children: N/A  . Years of Education: N/A   Occupational History  . Not on file.   Social History Main Topics  . Smoking status: Never Smoker   . Smokeless tobacco: Not on file  . Alcohol Use: 0.0 oz/week    0 Standard drinks or equivalent per week     Comment: Social  . Drug Use: No  . Sexual Activity: Not on file   Other Topics Concern  . Not on file   Social History Narrative      Review of Systems  All other systems reviewed and are negative.      Objective:   Physical Exam  HENT:  Right Ear: Tympanic membrane, external ear and ear canal normal.  Left Ear: Tympanic membrane, external ear and ear canal normal.  Nose: Mucosal edema and rhinorrhea present.  Mouth/Throat: Posterior oropharyngeal erythema present. No oropharyngeal exudate.  Eyes: Conjunctivae are normal. Right eye exhibits no discharge. Left eye exhibits no discharge.  Neck: Neck supple.  Cardiovascular: Normal rate, regular rhythm and  normal heart sounds.   Pulmonary/Chest: Effort normal and breath sounds normal. No respiratory distress. She has no wheezes. She has no rales.  Lymphadenopathy:    She has cervical adenopathy.  Vitals reviewed.         Assessment & Plan:  Sore throat - Plan: STREP GROUP A AG, W/REFLEX TO CULT, amoxicillin (AMOXIL) 875 MG tablet  Fever, unspecified - Plan: STREP GROUP A AG, W/REFLEX TO CULT  I believe the patient likely has a viral upper respiratory infection/viral pharyngitis. I recommended ibuprofen 800 mg every 8 hours, Sudafed for congestion, Cepacol lozenges. Symptoms should improve over the next week. If sore throat worsens or she develops purulent exudate in her posterior oropharynx, I did give her a prescription for amoxicillin 875 mg by mouth twice a day for 10 days but she will not fill the prescription unless certain criteria are met.

## 2015-06-27 LAB — CULTURE, GROUP A STREP: Organism ID, Bacteria: NORMAL

## 2015-07-02 DIAGNOSIS — J321 Chronic frontal sinusitis: Secondary | ICD-10-CM | POA: Diagnosis not present

## 2015-07-02 DIAGNOSIS — J320 Chronic maxillary sinusitis: Secondary | ICD-10-CM | POA: Diagnosis not present

## 2015-07-02 DIAGNOSIS — J322 Chronic ethmoidal sinusitis: Secondary | ICD-10-CM | POA: Diagnosis not present

## 2015-07-02 DIAGNOSIS — J301 Allergic rhinitis due to pollen: Secondary | ICD-10-CM | POA: Diagnosis not present

## 2015-07-02 DIAGNOSIS — J324 Chronic pansinusitis: Secondary | ICD-10-CM | POA: Diagnosis not present

## 2015-08-03 DIAGNOSIS — J329 Chronic sinusitis, unspecified: Secondary | ICD-10-CM | POA: Diagnosis not present

## 2015-08-29 DIAGNOSIS — J329 Chronic sinusitis, unspecified: Secondary | ICD-10-CM | POA: Diagnosis not present

## 2015-09-10 DIAGNOSIS — Z1231 Encounter for screening mammogram for malignant neoplasm of breast: Secondary | ICD-10-CM | POA: Diagnosis not present

## 2015-09-10 DIAGNOSIS — Z6832 Body mass index (BMI) 32.0-32.9, adult: Secondary | ICD-10-CM | POA: Diagnosis not present

## 2015-09-10 DIAGNOSIS — Z01419 Encounter for gynecological examination (general) (routine) without abnormal findings: Secondary | ICD-10-CM | POA: Diagnosis not present

## 2015-09-10 DIAGNOSIS — Z1151 Encounter for screening for human papillomavirus (HPV): Secondary | ICD-10-CM | POA: Diagnosis not present

## 2015-09-24 DIAGNOSIS — R030 Elevated blood-pressure reading, without diagnosis of hypertension: Secondary | ICD-10-CM | POA: Diagnosis not present

## 2015-09-24 DIAGNOSIS — R208 Other disturbances of skin sensation: Secondary | ICD-10-CM | POA: Diagnosis not present

## 2015-09-24 DIAGNOSIS — R200 Anesthesia of skin: Secondary | ICD-10-CM | POA: Diagnosis not present

## 2015-10-03 DIAGNOSIS — Z6833 Body mass index (BMI) 33.0-33.9, adult: Secondary | ICD-10-CM | POA: Diagnosis not present

## 2015-10-03 DIAGNOSIS — E669 Obesity, unspecified: Secondary | ICD-10-CM | POA: Diagnosis not present

## 2015-10-09 DIAGNOSIS — M7541 Impingement syndrome of right shoulder: Secondary | ICD-10-CM | POA: Diagnosis not present

## 2015-10-09 DIAGNOSIS — M5387 Other specified dorsopathies, lumbosacral region: Secondary | ICD-10-CM | POA: Diagnosis not present

## 2015-10-09 DIAGNOSIS — M7611 Psoas tendinitis, right hip: Secondary | ICD-10-CM | POA: Diagnosis not present

## 2015-10-09 DIAGNOSIS — M7612 Psoas tendinitis, left hip: Secondary | ICD-10-CM | POA: Diagnosis not present

## 2015-11-07 DIAGNOSIS — Z713 Dietary counseling and surveillance: Secondary | ICD-10-CM | POA: Diagnosis not present

## 2015-11-07 DIAGNOSIS — Z6832 Body mass index (BMI) 32.0-32.9, adult: Secondary | ICD-10-CM | POA: Diagnosis not present

## 2015-11-22 DIAGNOSIS — L820 Inflamed seborrheic keratosis: Secondary | ICD-10-CM | POA: Diagnosis not present

## 2015-11-22 DIAGNOSIS — Z1283 Encounter for screening for malignant neoplasm of skin: Secondary | ICD-10-CM | POA: Diagnosis not present

## 2015-11-22 DIAGNOSIS — L812 Freckles: Secondary | ICD-10-CM | POA: Diagnosis not present

## 2015-11-22 DIAGNOSIS — D485 Neoplasm of uncertain behavior of skin: Secondary | ICD-10-CM | POA: Diagnosis not present

## 2015-11-22 DIAGNOSIS — D229 Melanocytic nevi, unspecified: Secondary | ICD-10-CM | POA: Diagnosis not present

## 2015-11-28 DIAGNOSIS — M25511 Pain in right shoulder: Secondary | ICD-10-CM | POA: Diagnosis not present

## 2015-11-28 DIAGNOSIS — M359 Systemic involvement of connective tissue, unspecified: Secondary | ICD-10-CM | POA: Diagnosis not present

## 2015-11-28 DIAGNOSIS — I7300 Raynaud's syndrome without gangrene: Secondary | ICD-10-CM | POA: Diagnosis not present

## 2015-11-28 DIAGNOSIS — M2550 Pain in unspecified joint: Secondary | ICD-10-CM | POA: Diagnosis not present

## 2016-01-17 ENCOUNTER — Ambulatory Visit (INDEPENDENT_AMBULATORY_CARE_PROVIDER_SITE_OTHER): Payer: BLUE CROSS/BLUE SHIELD | Admitting: Podiatry

## 2016-01-17 ENCOUNTER — Ambulatory Visit (INDEPENDENT_AMBULATORY_CARE_PROVIDER_SITE_OTHER): Payer: BLUE CROSS/BLUE SHIELD

## 2016-01-17 VITALS — Temp 98.0°F | Resp 16 | Ht 63.0 in | Wt 168.0 lb

## 2016-01-17 DIAGNOSIS — M84374A Stress fracture, right foot, initial encounter for fracture: Secondary | ICD-10-CM

## 2016-01-17 DIAGNOSIS — M79673 Pain in unspecified foot: Secondary | ICD-10-CM | POA: Diagnosis not present

## 2016-01-17 DIAGNOSIS — R52 Pain, unspecified: Secondary | ICD-10-CM | POA: Diagnosis not present

## 2016-01-17 NOTE — Progress Notes (Signed)
   Subjective:    Patient ID: Veronica Armstrong, female    DOB: 1975/04/14, 40 y.o.   MRN: [id number]  HPI 40 year old female presents the office with concerns of right foot pain which is been ongoing the last couple months but has been worsening over the last week. She has a history of a stress fracture in 2010. She states that she'll get bruise on the right foot she's had some swelling. She's having no pain in the left foot today. She denies any recent injury or trauma. Denies any change increase in activity level. She's had no treatment for this. No other complaints at this time.   She states that she has a vitamin D deficiency however has been normal recently she takes this daily. She also is on Plaquenil for which she describes as "regular arthritis".    Review of Systems  All other systems reviewed and are negative.      Objective:   Physical Exam General: AAO x3, NAD  Dermatological: Skin is warm, dry and supple bilateral. Nails x 10 are well manicured; remaining integument appears unremarkable at this time. There are no open sores, no preulcerative lesions, no rash or signs of infection present.  Vascular: Dorsalis Pedis artery and Posterior Tibial artery pedal pulses are 2/4 bilateral with immedate capillary fill time. Mild swelling to the dorsal aspect of the right foot. There is no pain with calf compression, swelling, warmth, erythema.   Neruologic: Grossly intact via light touch bilateral. Vibratory intact via tuning fork bilateral. Protective threshold with Semmes Wienstein monofilament intact to all pedal sites bilateral.   Musculoskeletal: There is pinpoint tenderness the distal aspect of the third and fourth metatarsals in the patient's right foot. There is overlying edema without any erythema or increase in warmth. There is no other areas of tenderness. There is pain vibratory sensation of the third fourth metatarsals. No other areas of vibratory pain. Muscular strength 5/5 in  all groups tested bilateral.  Gait: Unassisted, Nonantalgic.     Assessment & Plan:  40 year old female right third metatarsal stress fracture. -Treatment options discussed including all alternatives, risks, and complications -Etiology of symptoms were discussed -X-rays were obtained and reviewed with the patient. There is an area of cortical changes of the distal aspect of the third metatarsal concerning for stress fracture. No evidence of acute fracture otherwise. -Dispensed CAM boot -Ice and elevation. -Limit activity.  -Follow-up as scheduled or sooner if any problems arise. In the meantime, encouraged to call the office with any questions, concerns, change in symptoms.   Ovid CurdMatthew Wagoner, DPM

## 2016-01-17 NOTE — Patient Instructions (Signed)
Stress Fracture Stress fracture is a small break or crack in a bone. A stress fracture can be fully broken (complete) or partially broken (incomplete). The most common sites for stress fractures are the bones in the front of your feet (metatarsals), your heels (calcaneus), and the long bone of your lower leg (tibia). What are the causes? A stress fracture is caused by overuse or repetitive exercise, such as running. It happens when a bone cannot absorb any more shock because the muscles around it are weak. Stress fractures happen most commonly when:  You rapidly increase or start a new physical activity.  You use shoes that are worn out or do not fit you properly.  You exercise on a new surface. What increases the risk? You may be at higher risk for this type of fracture if:  You have a condition that causes weak bones (osteoporosis).  You are female. Stress fractures are more likely to occur in women. What are the signs or symptoms? The most common symptom of a stress fracture is feeling pain when you are using the affected part of your body. The pain usually goes away when you are resting. Other symptoms may include:  Swelling of the affected area.  Pain in the area when it is touched.  Decreased pain while resting. Stress fracture pain usually develops over time. How is this diagnosed? Diagnosis may include:  Medical history and physical exam.  X-rays.  Bone scan.  MRI. How is this treated? Treatment depends on the severity of your stress fracture. Treatment usually involves resting, icing, compression, and elevation (RICE) of the affected part of your body. Treatment may also include:  Medicines to reduce inflammation.  A cast or a walking shoe.  Crutches.  Surgery. Follow these instructions at home: If you have a cast:  Do not stick anything inside the cast to scratch your skin. Doing that increases your risk of infection.  Check the skin around the cast every  day. Report any concerns to your health care provider. You may put lotion on dry skin around the edges of the cast. Do not apply lotion to the skin underneath the cast.  Keep the cast clean and dry.  Cover the cast with a watertight plastic bag to protect it from water while you take a bath or a shower. Do not let the cast get wet.  Do not put pressure on any part of the cast until it is fully hardened. This may take several hours. If You Have a Walking Shoe:   Wear it as directed by your health care provider. Managing pain, stiffness, and swelling  If directed, apply ice to the injured area:  Put ice in a plastic bag.  Place a towel between your skin and the bag.  Leave the ice on for 20 minutes, 2-3 times per day.  Move your fingers or toes often to avoid stiffness and to lessen swelling.  Raise the injured area above the level of your heart while you are sitting or lying down. Activity  Rest as directed by your health care provider. Ask your health care provider if you may do alternative exercises, such as swimming or biking, while you are healing.  Return to your normal activities as directed by your health care provider. Ask your health care provider what activities are safe for you.  Perform range-of-motion exercises only as directed by your health care provider. Safety  Do not use the injured limb to support yourbody weight until your health   care provider says that you can. Use crutches if your health care provider tells you to do so. General instructions  Do not use any tobacco products, including cigarettes, chewing tobacco, or electronic cigarettes. Tobacco can delay bone healing. If you need help quitting, ask your health care provider.  Take medicines only as directed by your health care provider.  Keep all follow-up visits as directed by your health care provider. This is important. How is this prevented?  Only wear shoes that:  Fit well.  Are not worn  out.  Eat a healthy diet that contains vitamin D and calcium. This helps keeps your bones strong.  Be careful when you start a new physical activity. Give your body time to adjust.  Avoid doing only one kind of activity. Do different exercises, such as swimming and running, so that no single part of your body gets overused.  Do strength-training exercises. Contact a health care provider if:  Your pain gets worse.  You have new symptoms.  You have increased swelling. Get help right away if:  You lose feeling in the affected area. This information is not intended to replace advice given to you by your health care provider. Make sure you discuss any questions you have with your health care provider. Document Released: 05/10/2002 Document Revised: 10/17/2015 Document Reviewed: 09/22/2013 Elsevier Interactive Patient Education  2017 [employer]  

## 2016-01-18 DIAGNOSIS — M25511 Pain in right shoulder: Secondary | ICD-10-CM | POA: Diagnosis not present

## 2016-01-18 DIAGNOSIS — M5412 Radiculopathy, cervical region: Secondary | ICD-10-CM | POA: Diagnosis not present

## 2016-01-21 DIAGNOSIS — M542 Cervicalgia: Secondary | ICD-10-CM | POA: Diagnosis not present

## 2016-01-21 DIAGNOSIS — M84374A Stress fracture, right foot, initial encounter for fracture: Secondary | ICD-10-CM | POA: Insufficient documentation

## 2016-01-22 DIAGNOSIS — M531 Cervicobrachial syndrome: Secondary | ICD-10-CM | POA: Diagnosis not present

## 2016-01-22 DIAGNOSIS — M50123 Cervical disc disorder at C6-C7 level with radiculopathy: Secondary | ICD-10-CM | POA: Diagnosis not present

## 2016-01-22 DIAGNOSIS — M5412 Radiculopathy, cervical region: Secondary | ICD-10-CM | POA: Diagnosis not present

## 2016-01-29 DIAGNOSIS — M5114 Intervertebral disc disorders with radiculopathy, thoracic region: Secondary | ICD-10-CM | POA: Diagnosis not present

## 2016-01-29 DIAGNOSIS — M531 Cervicobrachial syndrome: Secondary | ICD-10-CM | POA: Diagnosis not present

## 2016-01-29 DIAGNOSIS — M50123 Cervical disc disorder at C6-C7 level with radiculopathy: Secondary | ICD-10-CM | POA: Diagnosis not present

## 2016-02-01 DIAGNOSIS — M5412 Radiculopathy, cervical region: Secondary | ICD-10-CM | POA: Insufficient documentation

## 2016-02-05 DIAGNOSIS — Z6830 Body mass index (BMI) 30.0-30.9, adult: Secondary | ICD-10-CM | POA: Diagnosis not present

## 2016-02-05 DIAGNOSIS — Z713 Dietary counseling and surveillance: Secondary | ICD-10-CM | POA: Diagnosis not present

## 2016-02-08 ENCOUNTER — Ambulatory Visit (INDEPENDENT_AMBULATORY_CARE_PROVIDER_SITE_OTHER): Payer: BLUE CROSS/BLUE SHIELD | Admitting: Podiatry

## 2016-02-08 ENCOUNTER — Ambulatory Visit (INDEPENDENT_AMBULATORY_CARE_PROVIDER_SITE_OTHER): Payer: BLUE CROSS/BLUE SHIELD

## 2016-02-08 DIAGNOSIS — M84374S Stress fracture, right foot, sequela: Secondary | ICD-10-CM

## 2016-02-09 NOTE — Progress Notes (Signed)
Subjective:  Patient presents today for follow-up evaluation of stress fracture to the third metatarsal right foot. Patient states the pain is gone. Patient presents today wearing a cam boot. Patient was last treated and managed by Dr. Ouida SillsMatt Wagoner. Patient states that the injury was approximately 2-3 months ago.    Objective/Physical Exam General: The patient is alert and oriented x3 in no acute distress.  Dermatology: Skin is warm, dry and supple bilateral lower extremities. Negative for open lesions or macerations.  Vascular: Palpable pedal pulses bilaterally. No edema or erythema noted. Capillary refill within normal limits.  Neurological: Epicritic and protective threshold grossly intact bilaterally.   Musculoskeletal Exam: Range of motion within normal limits to all pedal and ankle joints bilateral. Muscle strength 5/5 in all groups bilateral.   Radiographic Exam:  Bone callus formation noted about the neck of the third metatarsal. Complete healing appears to have occurred. Stress fracture is approximately 98% healed.  Assessment: #1 stress fracture third metatarsal right foot - healing as expected   Plan of Care:  #1 Patient was evaluated. X-rays were reviewed today. #2 patient can begin transitioning out of the cam boot into good supportive tennis shoes. #3 today we discussed conservative management of stress fracture healing. #4 return to clinic when necessary   Dr. Felecia ShellingBrent M. Mardene Lessig, DPM [facility]

## 2016-02-27 DIAGNOSIS — M5412 Radiculopathy, cervical region: Secondary | ICD-10-CM | POA: Diagnosis not present

## 2016-02-27 DIAGNOSIS — M50223 Other cervical disc displacement at C6-C7 level: Secondary | ICD-10-CM | POA: Diagnosis not present

## 2016-03-14 IMAGING — MR MR HEAD WO/W CM
11 series · 48 of 48 positions shown · IV contrast (multihance)
Comparison: Report of noncontrast head CT 11/12/1998 (no images
available).

CLINICAL DATA: 39-year-old female with left facial numbness, visual
changes, left hand tremor for 6-9 months. Query demyelinating
disease. Initial encounter.

EXAM:
MRI HEAD WITHOUT AND WITH CONTRAST
TECHNIQUE: Multiplanar, multiecho pulse sequences of the brain and surrounding
structures were obtained without and with intravenous contrast.
CONTRAST:  15mL MULTIHANCE GADOBENATE DIMEGLUMINE 529 MG/ML IV SOLN

[Series 5: T1 · sagittal · 4.0mm · 0.75mm/px · 2 of 31 slices shown (1 of 3)]
[im 1/31]
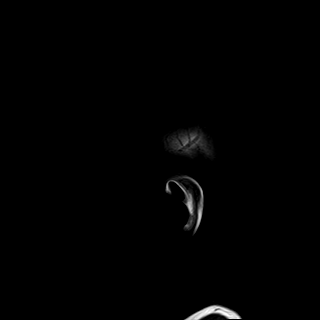
[im 31/31]
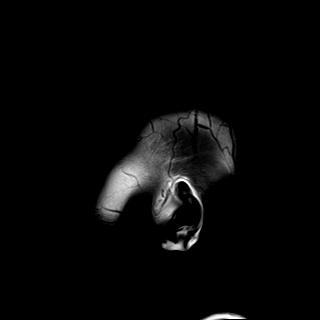

[Series 6: DWI · axial · 3.0mm · 1.44mm/px · z∈[-44,+91]mm · 6 of 84 slices shown (1 of 2)]
[im 1/84]
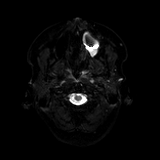
[im 17/84]
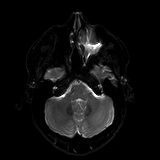
[im 34/84]
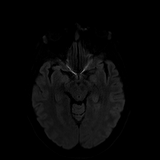
[im 50/84]
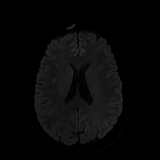
[im 67/84]
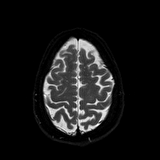
[im 84/84]
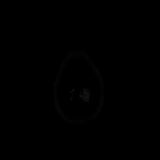

[Series 7: DWI · axial · 3.0mm · 1.44mm/px · z∈[-44,+91]mm · 3 of 42 slices shown (2 of 2)]
[im 1/42]
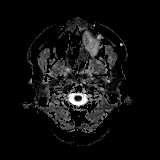
[im 21/42]
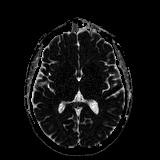
[im 42/42]
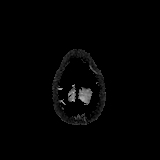

[Series 8: T2 · axial · 4.0mm · 0.36mm/px · z∈[-46,+89]mm · 2 of 27 slices shown (1 of 2)]
[im 1/27]
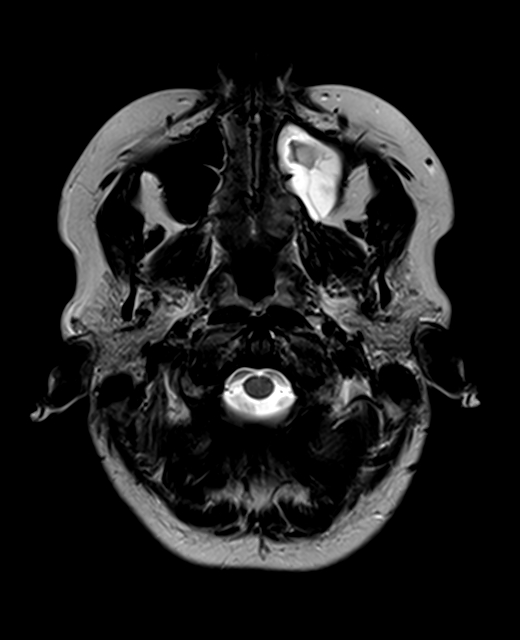
[im 27/27]
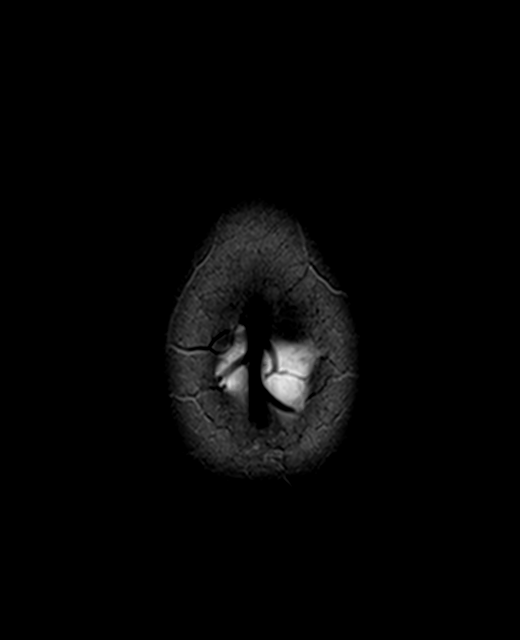

[Series 9: FLAIR · axial · 4.0mm · 0.72mm/px · z∈[-44,+91]mm · 2 of 27 slices shown (1 of 2)]
[im 1/27]
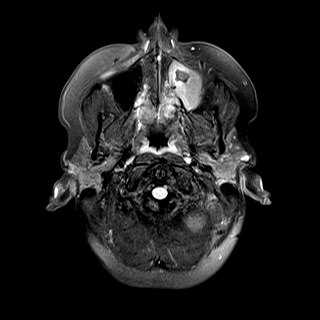
[im 27/27]
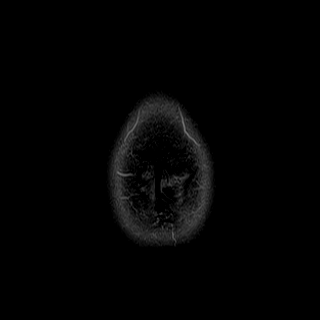

[Series 12: swi_images · axial · 1.5mm · 0.90mm/px · z∈[-50,+92]mm · 7 of 96 slices shown]
[im 1/96]
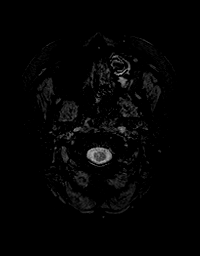
[im 16/96]
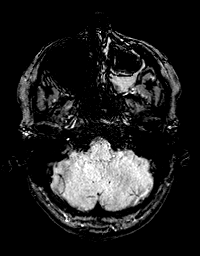
[im 32/96]
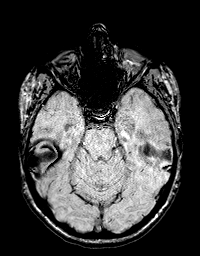
[im 48/96]
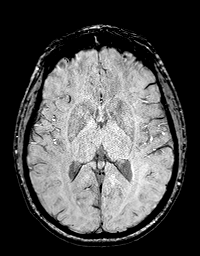
[im 64/96]
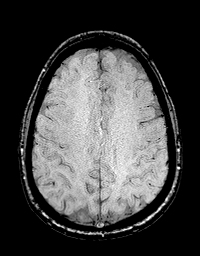
[im 80/96]
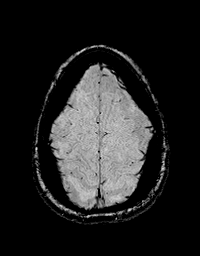
[im 96/96]
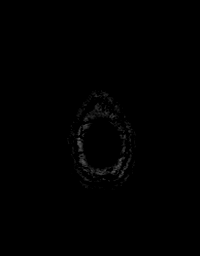

[Series 13: FLAIR · sagittal · 4.0mm · 0.72mm/px · 2 of 27 slices shown (2 of 2)]
[im 1/27]
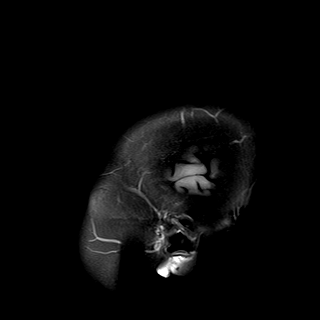
[im 27/27]
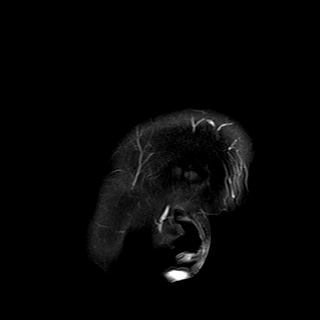

[Series 14: T1 · axial · 1.0mm · 0.90mm/px · z∈[-49,+93]mm · 10 of 144 slices shown (2 of 3)]
[im 1/144]
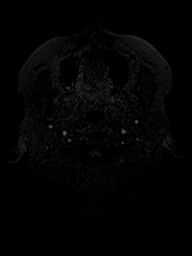
[im 16/144]
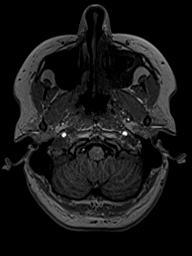
[im 32/144]
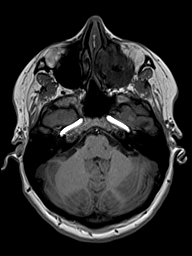
[im 48/144]
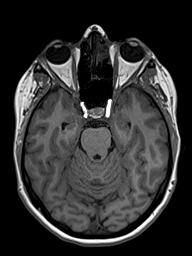
[im 64/144]
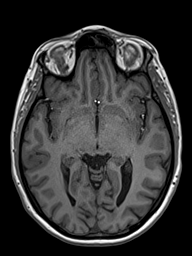
[im 80/144]
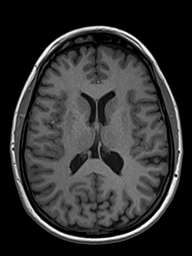
[im 96/144]
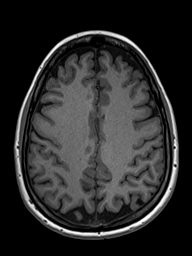
[im 112/144]
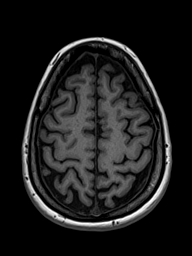
[im 128/144]
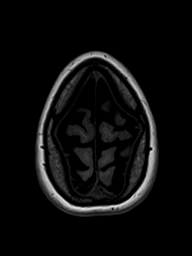
[im 144/144]
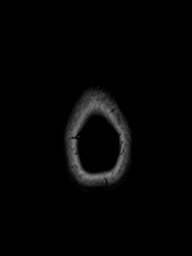

[Series 15: T2 · coronal · 4.5mm · 0.36mm/px · 2 of 30 slices shown (2 of 2)]
[im 1/30]
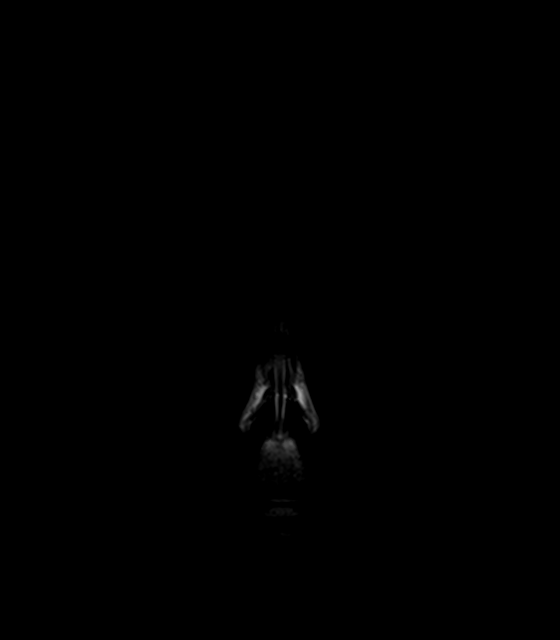
[im 30/30]
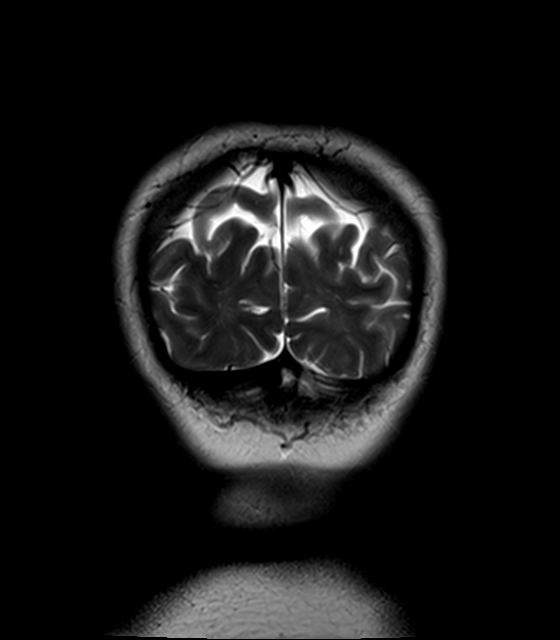

[Series 16: T1 · axial · 1.0mm · 0.90mm/px · z∈[-50,+93]mm · 10 of 144 slices shown (3 of 3)]
[im 1/144]
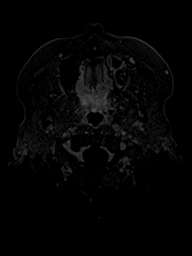
[im 16/144]
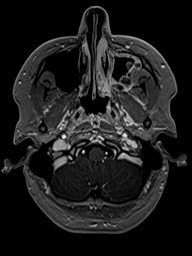
[im 32/144]
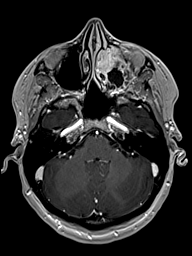
[im 48/144]
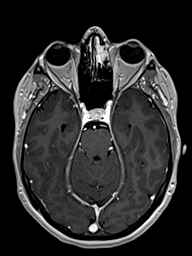
[im 64/144]
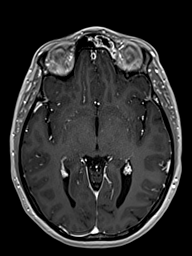
[im 80/144]
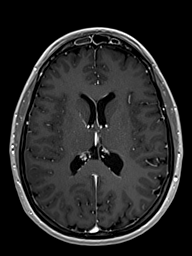
[im 96/144]
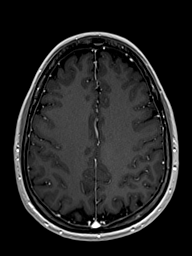
[im 112/144]
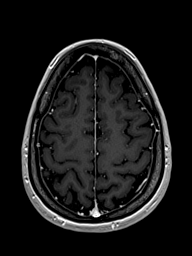
[im 128/144]
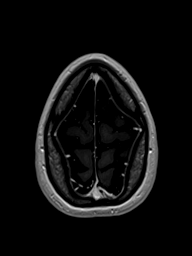
[im 144/144]
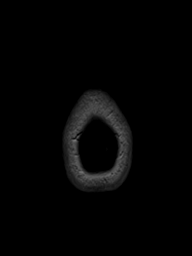

[Series 17: T1 post-contrast · coronal · 4.5mm · 0.90mm/px · 2 of 34 slices shown]
[im 1/34]
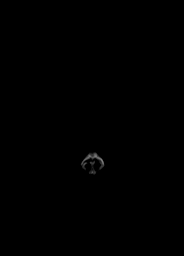
[im 34/34]
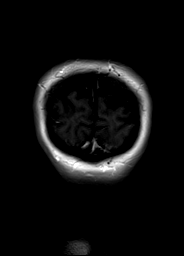

[48 of 48 positions shown; findings below may reference images not displayed]

FINDINGS: Cerebral volume is within normal limits. No restricted diffusion to
suggest acute infarction. No midline shift, mass effect, evidence of
mass lesion, ventriculomegaly, extra-axial collection or acute
intracranial hemorrhage. Cervicomedullary junction and pituitary are
within normal limits. Major intracranial vascular flow voids are
preserved.

Gray and white matter signal is within normal limits throughout the
brain. No encephalomalacia. No white matter lesions to suggest
demyelination. No chronic cerebral blood products. No abnormal
enhancement identified. No dural thickening.

Visible internal auditory structures appear normal. Mastoids are
clear. Moderate left maxillary sinus mucosal thickening and
superimposed fluid level. Mucosal thickening and opacification in
the anterior left ethmoid air cells and bilateral frontal sinuses.
Other paranasal sinuses are spared. Orbit and scalp soft tissues are
within normal limits. Negative visualized cervical spine and grossly
normal visualized cervical spinal cord. Visualized bone marrow
signal is within normal limits.
IMPRESSION: 1. Normal MRI appearance of the brain. No evidence of demyelinating
disease.
2. Obstructive pattern left and bilateral frontal paranasal
sinusitis.

## 2016-04-11 DIAGNOSIS — M5412 Radiculopathy, cervical region: Secondary | ICD-10-CM | POA: Diagnosis not present

## 2016-05-01 ENCOUNTER — Telehealth: Payer: Self-pay | Admitting: Medical

## 2016-05-01 ENCOUNTER — Ambulatory Visit: Payer: BLUE CROSS/BLUE SHIELD | Admitting: Medical

## 2016-05-01 VITALS — BP 112/88 | HR 95 | Temp 99.5°F | Resp 16 | Ht 63.0 in | Wt 168.0 lb

## 2016-05-01 DIAGNOSIS — F418 Other specified anxiety disorders: Secondary | ICD-10-CM

## 2016-05-01 DIAGNOSIS — Z6379 Other stressful life events affecting family and household: Secondary | ICD-10-CM

## 2016-05-01 DIAGNOSIS — I10 Essential (primary) hypertension: Secondary | ICD-10-CM

## 2016-05-01 MED ORDER — METOPROLOL SUCCINATE ER 25 MG PO TB24: 25.0000 mg | ORAL_TABLET | Freq: Every day | ORAL | 0 refills | Status: DC

## 2016-05-01 NOTE — Progress Notes (Signed)
Patient ID: Veronica Armstrong, female   DOB: [birth year], 41 y.o.   MRN: [id number]  Subjective:    Patient here for follow-up of elevated blood pressure.  She  States her stress level is about the same , though she states she is more accepting of the situation (father with diagnosis of alzheimer"s and dementia. She also states today that she has not made an appointment with counseling due to conflict of her and her husbands schedules and picking up their children from school. She does state that her pastor comes over weekly which helps.Taking medication at dinner time. Recheck by husband before she left for work  144/100. But she states she was running around try to get the children and herself off to school/work this morning. Patient denies: chest pain, dyspnea and dizziness.    Review of Systems Constitutional: negative Respiratory: negative Cardiovascular: negative     Objective:  NAD,  heart rrr, no murmur rubs or gallops. lungs ctab    Assessment:    Hypertension, Situational anxiety, Stress due to illness of family member.    Plan:    Will place patient on a longer acting metoprolol 25 mg one by mouth once a day.She will finish her current metoprolol before picking up the new medication. Recommned a  recheck in the office with me in 4 weeks. However she is to check her blood pressure daily and communicate these to me weekly by phone or My Chart and to return to the clinic sooner if she has any concerns. [facility] with counseling for anxiety.

## 2016-05-02 NOTE — Telephone Encounter (Signed)
Joni Reiningicole returned your call from yesterday. Please call her at ext. [phone].

## 2016-05-22 ENCOUNTER — Encounter: Payer: Self-pay | Admitting: Medical

## 2016-05-27 ENCOUNTER — Encounter: Payer: Self-pay | Admitting: Medical

## 2016-06-03 ENCOUNTER — Ambulatory Visit: Payer: BLUE CROSS/BLUE SHIELD | Admitting: Medical

## 2016-06-05 ENCOUNTER — Ambulatory Visit: Payer: BLUE CROSS/BLUE SHIELD | Admitting: Medical

## 2016-06-05 VITALS — BP 130/82 | HR 76 | Temp 99.6°F | Resp 16

## 2016-06-05 DIAGNOSIS — I10 Essential (primary) hypertension: Secondary | ICD-10-CM

## 2016-06-05 DIAGNOSIS — Z760 Encounter for issue of repeat prescription: Secondary | ICD-10-CM

## 2016-06-05 MED ORDER — METOPROLOL SUCCINATE ER 25 MG PO TB24: 25.0000 mg | ORAL_TABLET | Freq: Every day | ORAL | 0 refills | Status: DC

## 2016-06-05 NOTE — Progress Notes (Signed)
   Subjective:    Patient ID: Veronica Armstrong, female    DOB: 20-Feb-1976, 41 y.o.   MRN: [id number]  HPI 41 yo female  feeling better, blood pressures at home usually running  130's/ 80's . Anxiety at home better,     Review of Systems  Constitutional: Negative for chills and fever.  Eyes: Negative for itching.  Respiratory: Negative for shortness of breath.   Cardiovascular: Negative for chest pain.       Objective:   Physical Exam  Constitutional: She appears well-developed and well-nourished.  HENT:  Head: Normocephalic and atraumatic.  Eyes: EOM are normal. Pupils are equal, round, and reactive to light.  Cardiovascular: Normal rate, regular rhythm and normal heart sounds.  Exam reveals no gallop and no friction rub.   No murmur heard. Pulmonary/Chest: Effort normal and breath sounds normal.  Nursing note and vitals reviewed.         Assessment & Plan:  Hypertension , refill on  metorpolol succinate 25 mg one by mouth daily  #90 no refill , recheck in 3 months sooner if any concerns. Patient to continue to monitor blood pressure weekly.

## 2016-08-27 ENCOUNTER — Encounter: Payer: Self-pay | Admitting: Medical

## 2016-08-27 ENCOUNTER — Ambulatory Visit: Payer: BLUE CROSS/BLUE SHIELD | Admitting: Medical

## 2016-08-27 VITALS — BP 138/88 | HR 86 | Temp 97.2°F | Resp 16 | Ht 63.0 in | Wt 171.4 lb

## 2016-08-27 DIAGNOSIS — Z760 Encounter for issue of repeat prescription: Secondary | ICD-10-CM

## 2016-08-27 DIAGNOSIS — I10 Essential (primary) hypertension: Secondary | ICD-10-CM

## 2016-08-27 MED ORDER — METOPROLOL SUCCINATE ER 25 MG PO TB24: 25.0000 mg | ORAL_TABLET | Freq: Every day | ORAL | 1 refills | Status: DC

## 2016-08-27 NOTE — Progress Notes (Signed)
   Subjective:    Patient ID: Veronica Armstrong, female    DOB: [birth year], 41 y.o.   MRN: [id number]  HPI  41 yo female returns for recheck of blood pressure and refill of medications. Seen 3 months ago.  Feeling well, just returned from vacation to the beach one week.  Happy where her blood pressure is today , similar numbers at home. Checking blood pressure one time per week.  Her father recently sicker mid week on her vacation. So she has concerns about him. Return to the beach on July 14th. Review of Systems  Constitutional: Positive for chills. Negative for fever.  HENT: Positive for congestion. Negative for ear pain and sore throat.   Eyes: Negative for pain and discharge.  Respiratory: Negative for shortness of breath.   Cardiovascular: Negative for chest pain.  Gastrointestinal: Negative for abdominal pain.  Genitourinary: Negative for hematuria.  Musculoskeletal: Negative for myalgias.  Skin: Negative for rash.  Neurological: Negative for dizziness and syncope.       Objective:   Physical Exam  Constitutional: She is oriented to person, place, and time. She appears well-nourished.  HENT:  Head: Normocephalic and atraumatic.  Right Ear: External ear normal.  Left Ear: External ear normal.  Mouth/Throat: Oropharynx is clear and moist.  Eyes: Conjunctivae and EOM are normal. Pupils are equal, round, and reactive to light.  Neck: Normal range of motion. Neck supple.  Cardiovascular: Normal rate, regular rhythm and normal heart sounds.   Pulmonary/Chest: Effort normal and breath sounds normal.  Musculoskeletal: Normal range of motion.  Lymphadenopathy:    She has no cervical adenopathy.  Neurological: She is alert and oriented to person, place, and time.  Skin: Skin is warm and dry.  Psychiatric: She has a normal mood and affect. Her behavior is normal. Judgment and thought content normal.  Nursing note and vitals reviewed.         Assessment & Plan:  Hypertension  refilled blood pressure medication. E-prescribed Metoprolol Succinate 25 mg one by mouth daily. #90 one refill. Return to clinic in 6 months for blood pressure recheck.  Sooner if any concerns.

## 2016-09-24 DIAGNOSIS — Z130 Encounter for screening for diseases of the blood and blood-forming organs and certain disorders involving the immune mechanism: Secondary | ICD-10-CM | POA: Diagnosis not present

## 2016-09-24 DIAGNOSIS — Z6830 Body mass index (BMI) 30.0-30.9, adult: Secondary | ICD-10-CM | POA: Diagnosis not present

## 2016-09-24 DIAGNOSIS — N921 Excessive and frequent menstruation with irregular cycle: Secondary | ICD-10-CM | POA: Diagnosis not present

## 2016-09-24 DIAGNOSIS — Z131 Encounter for screening for diabetes mellitus: Secondary | ICD-10-CM | POA: Diagnosis not present

## 2016-09-24 DIAGNOSIS — Z1231 Encounter for screening mammogram for malignant neoplasm of breast: Secondary | ICD-10-CM | POA: Diagnosis not present

## 2016-09-24 DIAGNOSIS — Z0000 Encounter for general adult medical examination without abnormal findings: Secondary | ICD-10-CM | POA: Diagnosis not present

## 2016-09-24 DIAGNOSIS — F329 Major depressive disorder, single episode, unspecified: Secondary | ICD-10-CM | POA: Diagnosis not present

## 2016-09-24 DIAGNOSIS — Z1329 Encounter for screening for other suspected endocrine disorder: Secondary | ICD-10-CM | POA: Diagnosis not present

## 2016-09-24 DIAGNOSIS — Z01411 Encounter for gynecological examination (general) (routine) with abnormal findings: Secondary | ICD-10-CM | POA: Diagnosis not present

## 2016-09-24 DIAGNOSIS — Z13220 Encounter for screening for lipoid disorders: Secondary | ICD-10-CM | POA: Diagnosis not present

## 2016-09-24 DIAGNOSIS — E538 Deficiency of other specified B group vitamins: Secondary | ICD-10-CM | POA: Diagnosis not present

## 2016-09-24 DIAGNOSIS — N809 Endometriosis, unspecified: Secondary | ICD-10-CM | POA: Diagnosis not present

## 2016-09-25 ENCOUNTER — Other Ambulatory Visit: Payer: Self-pay | Admitting: Obstetrics

## 2016-09-25 DIAGNOSIS — R928 Other abnormal and inconclusive findings on diagnostic imaging of breast: Secondary | ICD-10-CM

## 2016-10-01 ENCOUNTER — Other Ambulatory Visit: Payer: Self-pay | Admitting: Obstetrics

## 2016-10-01 ENCOUNTER — Ambulatory Visit
Admission: RE | Admit: 2016-10-01 | Discharge: 2016-10-01 | Disposition: A | Discharge status: Home / Self Care | Payer: BLUE CROSS/BLUE SHIELD | Source: Ambulatory Visit | Attending: Obstetrics | Admitting: Obstetrics

## 2016-10-01 DIAGNOSIS — R928 Other abnormal and inconclusive findings on diagnostic imaging of breast: Secondary | ICD-10-CM

## 2016-10-01 DIAGNOSIS — N6489 Other specified disorders of breast: Secondary | ICD-10-CM | POA: Diagnosis not present

## 2016-10-01 DIAGNOSIS — R922 Inconclusive mammogram: Secondary | ICD-10-CM | POA: Diagnosis not present

## 2016-10-01 DIAGNOSIS — R599 Enlarged lymph nodes, unspecified: Secondary | ICD-10-CM

## 2016-10-03 ENCOUNTER — Other Ambulatory Visit: Payer: Self-pay | Admitting: Obstetrics

## 2016-10-03 ENCOUNTER — Other Ambulatory Visit (HOSPITAL_COMMUNITY)
Admission: RE | Admit: 2016-10-03 | Discharge: 2016-10-03 | Disposition: A | Discharge status: Home / Self Care | Payer: BLUE CROSS/BLUE SHIELD | Source: Ambulatory Visit | Attending: Obstetrics | Admitting: Obstetrics

## 2016-10-03 ENCOUNTER — Ambulatory Visit
Admission: RE | Admit: 2016-10-03 | Discharge: 2016-10-03 | Disposition: A | Discharge status: Home / Self Care | Payer: BLUE CROSS/BLUE SHIELD | Source: Ambulatory Visit | Attending: Obstetrics | Admitting: Obstetrics

## 2016-10-03 DIAGNOSIS — R590 Localized enlarged lymph nodes: Secondary | ICD-10-CM | POA: Diagnosis not present

## 2016-10-03 DIAGNOSIS — R599 Enlarged lymph nodes, unspecified: Secondary | ICD-10-CM

## 2017-01-28 DIAGNOSIS — Z1283 Encounter for screening for malignant neoplasm of skin: Secondary | ICD-10-CM | POA: Diagnosis not present

## 2017-01-28 DIAGNOSIS — D485 Neoplasm of uncertain behavior of skin: Secondary | ICD-10-CM | POA: Diagnosis not present

## 2017-01-28 DIAGNOSIS — L820 Inflamed seborrheic keratosis: Secondary | ICD-10-CM | POA: Diagnosis not present

## 2017-01-28 DIAGNOSIS — D1801 Hemangioma of skin and subcutaneous tissue: Secondary | ICD-10-CM | POA: Diagnosis not present

## 2017-02-05 ENCOUNTER — Encounter: Payer: Self-pay | Admitting: Adult Health

## 2017-02-05 ENCOUNTER — Ambulatory Visit: Payer: BLUE CROSS/BLUE SHIELD | Admitting: Adult Health

## 2017-02-05 ENCOUNTER — Ambulatory Visit: Payer: BLUE CROSS/BLUE SHIELD | Admitting: Medical

## 2017-02-05 VITALS — BP 148/94 | HR 87 | Temp 98.2°F | Resp 16 | Ht 62.0 in | Wt 167.0 lb

## 2017-02-05 DIAGNOSIS — I10 Essential (primary) hypertension: Secondary | ICD-10-CM

## 2017-02-05 MED ORDER — METOPROLOL SUCCINATE ER 25 MG PO TB24: 25.0000 mg | ORAL_TABLET | Freq: Every day | ORAL | 0 refills | Status: DC

## 2017-02-05 NOTE — Progress Notes (Signed)
Subjective:     Patient ID: Veronica Armstrong, female   DOB: 08/14/1975, 41 y.o.   MRN: [id number]  HPI  Patient is a 40 year old female in no acute distress wh presents to the office for a follow up on her hypertension and medication refill. She is taking her Metoprolol succinate 25 mg daily per her report and denies any abnormal side effects.   She reports she is getting 130's over 70's readings at home with hearts 80 beats per minutes.  She denies any diaphoresis, chest pain, no dyspnea on exertion.  Patient  denies any fever, chills, rash, chest pain, shortness of breath, nausea, vomiting, or diarrhea.  She denies any muscle pain or weakness. She denies any edema. She denies any dizziness or lightheadedness.   She reports she had labs done in her GYN office in June last and results are not seen in [place]. Will check today to have on file.    Blood pressure 138/78, pulse 87, temperature 98.2 F (36.8 C), resp. rate 16, height _0  (1.575 m), weight 167 lb (75.8 kg), SpO2 97 %.  Susy Frizzle, MD   Patient Active Problem List   Diagnosis Date Noted  . Stress fracture, right foot, initial encounter for fracture 01/21/2016    Review of Systems  Constitutional: Negative.   HENT: Negative.   Eyes: Negative.   Respiratory: Negative.   Cardiovascular: Negative.   Gastrointestinal: Negative.   Genitourinary: Negative.   Musculoskeletal: Negative.   Skin: Negative.   Neurological: Negative.   Psychiatric/Behavioral: Negative.        Anxiousness she reports " white coat syndrome " and is anxious nervous in presentation.       Objective:   Physical Exam  Constitutional: She is oriented to person, place, and time. She appears well-developed and well-nourished. No distress.  HENT:  Head: Normocephalic and atraumatic.  Eyes: Conjunctivae and EOM are normal. Pupils are equal, round, and reactive to light.  Cardiovascular: Normal rate, regular rhythm, normal heart sounds and intact  distal pulses. Exam reveals no gallop.  No murmur heard. Pulmonary/Chest: Effort normal and breath sounds normal. No respiratory distress. She has no wheezes. She has no rales. She exhibits no tenderness.  Abdominal: Soft.  Musculoskeletal: Normal range of motion.  Neurological: She is alert and oriented to person, place, and time. She has normal reflexes.  Skin: Skin is warm and dry. No rash noted. She is not diaphoretic. No erythema. No pallor.  Psychiatric: She has a normal mood and affect. Her behavior is normal. Judgment and thought content normal.  She reports white coat syndrome and does seem anxious in behavior when talking with patient.   Nursing note and vitals reviewed.      Assessment:     Hypertension, unspecified type - Plan: CBC, Comp Met (CMET)      Plan:     Will continue current regimen. She is to follow up with her Susy Frizzle, MD for physical.   .  Meds ordered this encounter  Medications  . metoprolol succinate (TOPROL-XL) 25 MG 24 hr tablet    Sig: Take 1 tablet (25 mg total) by mouth daily.    Dispense:  90 tablet    Refill:  0   Advised to return to clinic for an appointment if no improvement within 72 hours or if any symptoms change or worsen. Advised ER or urgent Care if after hours or on weekend. 911 for emergency symptoms at any time.  Patient verbalized understanding of all instructions given and denies any further questions at this time.

## 2017-02-05 NOTE — Patient Instructions (Signed)
Metoprolol tablets What is this medicine? METOPROLOL (me TOE proe lole) is a beta-blocker. Beta-blockers reduce the workload on the heart and help it to beat more regularly. This medicine is used to treat high blood pressure and to prevent chest pain. It is also used to after a heart attack and to prevent an additional heart attack from occurring. This medicine may be used for other purposes; ask your health care provider or pharmacist if you have questions. COMMON BRAND NAME(S): Lopressor What should I tell my health care provider before I take this medicine? They need to know if you have any of these conditions: -diabetes -heart or vessel disease like slow heart rate, worsening heart failure, heart block, sick sinus syndrome or Raynaud's disease -kidney disease -liver disease -lung or breathing disease, like asthma or emphysema -pheochromocytoma -thyroid disease -an unusual or allergic reaction to metoprolol, other beta-blockers, medicines, foods, dyes, or preservatives -pregnant or trying to get pregnant -breast-feeding How should I use this medicine? Take this medicine by mouth with a drink of water. Follow the directions on the prescription label. Take this medicine immediately after meals. Take your doses at regular intervals. Do not take more medicine than directed. Do not stop taking this medicine suddenly. This could lead to serious heart-related effects. Talk to your pediatrician regarding the use of this medicine in children. Special care may be needed. Overdosage: If you think you have taken too much of this medicine contact a poison control center or emergency room at once. NOTE: This medicine is only for you. Do not share this medicine with others. What if I miss a dose? If you miss a dose, take it as soon as you can. If it is almost time for your next dose, take only that dose. Do not take double or extra doses. What may interact with this medicine? This medicine may interact  with the following medications: -certain medicines for blood pressure, heart disease, irregular heart beat -certain medicines for depression like monoamine oxidase (MAO) inhibitors, fluoxetine, or paroxetine -clonidine -dobutamine -epinephrine -isoproterenol -reserpine This list may not describe all possible interactions. Give your health care provider a list of all the medicines, herbs, non-prescription drugs, or dietary supplements you use. Also tell them if you smoke, drink alcohol, or use illegal drugs. Some items may interact with your medicine. What should I watch for while using this medicine? Visit your doctor or health care professional for regular check ups. Contact your doctor right away if your symptoms worsen. Check your blood pressure and pulse rate regularly. Ask your health care professional what your blood pressure and pulse rate should be, and when you should contact them. You may get drowsy or dizzy. Do not drive, use machinery, or do anything that needs mental alertness until you know how this medicine affects you. Do not sit or stand up quickly, especially if you are an older patient. This reduces the risk of dizzy or fainting spells. Contact your doctor if these symptoms continue. Alcohol may interfere with the effect of this medicine. Avoid alcoholic drinks. What side effects may I notice from receiving this medicine? Side effects that you should report to your doctor or health care professional as soon as possible: -allergic reactions like skin rash, itching or hives -cold or numb hands or feet -depression -difficulty breathing -faint -fever with sore throat -irregular heartbeat, chest pain -rapid weight gain -swollen legs or ankles Side effects that usually do not require medical attention (report to your doctor or health care professional   if they continue or are bothersome): -anxiety or nervousness -change in sex drive or performance -dry  skin -headache -nightmares or trouble sleeping -short term memory loss -stomach upset or diarrhea -unusually tired This list may not describe all possible side effects. Call your doctor for medical advice about side effects. You may report side effects to FDA at 1-800-FDA-1088. Where should I keep my medicine? Keep out of the reach of children. Store at room temperature between 15 and 30 degrees C (59 and 86 degrees F). Throw away any unused medicine after the expiration date. NOTE: This sheet is a summary. It may not cover all possible information. If you have questions about this medicine, talk to your doctor, pharmacist, or health care provider.  2018 Elsevier/Gold Standard (2012-10-22 14:40:36) Hypertension Hypertension, commonly called high blood pressure, is when the force of blood pumping through the arteries is too strong. The arteries are the blood vessels that carry blood from the heart throughout the body. Hypertension forces the heart to work harder to pump blood and may cause arteries to become narrow or stiff. Having untreated or uncontrolled hypertension can cause heart attacks, strokes, kidney disease, and other problems. A blood pressure reading consists of a higher number over a lower number. Ideally, your blood pressure should be below 120/80. The first ("top") number is called the systolic pressure. It is a measure of the pressure in your arteries as your heart beats. The second ("bottom") number is called the diastolic pressure. It is a measure of the pressure in your arteries as the heart relaxes. What are the causes? The cause of this condition is not known. What increases the risk? Some risk factors for high blood pressure are under your control. Others are not. Factors you can change  Smoking.  Having type 2 diabetes mellitus, high cholesterol, or both.  Not getting enough exercise or physical activity.  Being overweight.  Having too much fat, sugar, calories,  or salt (sodium) in your diet.  Drinking too much alcohol. Factors that are difficult or impossible to change  Having chronic kidney disease.  Having a family history of high blood pressure.  Age. Risk increases with age.  Race. You may be at higher risk if you are African-American.  Gender. Men are at higher risk than women before age 42. After age 42, women are at higher risk than men.  Having obstructive sleep apnea.  Stress. What are the signs or symptoms? Extremely high blood pressure (hypertensive crisis) may cause:  Headache.  Anxiety.  Shortness of breath.  Nosebleed.  Nausea and vomiting.  Severe chest pain.  Jerky movements you cannot control (seizures).  How is this diagnosed? This condition is diagnosed by measuring your blood pressure while you are seated, with your arm resting on a surface. The cuff of the blood pressure monitor will be placed directly against the skin of your upper arm at the level of your heart. It should be measured at least twice using the same arm. Certain conditions can cause a difference in blood pressure between your right and left arms. Certain factors can cause blood pressure readings to be lower or higher than normal (elevated) for a short period of time:  When your blood pressure is higher when you are in a health care provider's office than when you are at home, this is called white coat hypertension. Most people with this condition do not need medicines.  When your blood pressure is higher at home than when you are in a  health care provider's office, this is called masked hypertension. Most people with this condition may need medicines to control blood pressure.  If you have a high blood pressure reading during one visit or you have normal blood pressure with other risk factors:  You may be asked to return on a different day to have your blood pressure checked again.  You may be asked to monitor your blood pressure at home  for 1 week or longer.  If you are diagnosed with hypertension, you may have other blood or imaging tests to help your health care provider understand your overall risk for other conditions. How is this treated? This condition is treated by making healthy lifestyle changes, such as eating healthy foods, exercising more, and reducing your alcohol intake. Your health care provider may prescribe medicine if lifestyle changes are not enough to get your blood pressure under control, and if:  Your systolic blood pressure is above 130.  Your diastolic blood pressure is above 80.  Your personal target blood pressure may vary depending on your medical conditions, your age, and other factors. Follow these instructions at home: Eating and drinking  Eat a diet that is high in fiber and potassium, and low in sodium, added sugar, and fat. An example eating plan is called the DASH (Dietary Approaches to Stop Hypertension) diet. To eat this way: ? Eat plenty of fresh fruits and vegetables. Try to fill half of your plate at each meal with fruits and vegetables. ? Eat whole grains, such as whole wheat pasta, brown rice, or whole grain bread. Fill about one quarter of your plate with whole grains. ? Eat or drink low-fat dairy products, such as skim milk or low-fat yogurt. ? Avoid fatty cuts of meat, processed or cured meats, and poultry with skin. Fill about one quarter of your plate with lean proteins, such as fish, chicken without skin, beans, eggs, and tofu. ? Avoid premade and processed foods. These tend to be higher in sodium, added sugar, and fat.  Reduce your daily sodium intake. Most people with hypertension should eat less than 1,500 mg of sodium a day.  Limit alcohol intake to no more than 1 drink a day for nonpregnant women and 2 drinks a day for men. One drink equals 12 oz of beer, 5 oz of wine, or 1 oz of hard liquor. Lifestyle  Work with your health care provider to maintain a healthy body  weight or to lose weight. Ask what an ideal weight is for you.  Get at least 30 minutes of exercise that causes your heart to beat faster (aerobic exercise) most days of the week. Activities may include walking, swimming, or biking.  Include exercise to strengthen your muscles (resistance exercise), such as pilates or lifting weights, as part of your weekly exercise routine. Try to do these types of exercises for 30 minutes at least 3 days a week.  Do not use any products that contain nicotine or tobacco, such as cigarettes and e-cigarettes. If you need help quitting, ask your health care provider.  Monitor your blood pressure at home as told by your health care provider.  Keep all follow-up visits as told by your health care provider. This is important. Medicines  Take over-the-counter and prescription medicines only as told by your health care provider. Follow directions carefully. Blood pressure medicines must be taken as prescribed.  Do not skip doses of blood pressure medicine. Doing this puts you at risk for problems and can make the  medicine less effective.  Ask your health care provider about side effects or reactions to medicines that you should watch for. Contact a health care provider if:  You think you are having a reaction to a medicine you are taking.  You have headaches that keep coming back (recurring).  You feel dizzy.  You have swelling in your ankles.  You have trouble with your vision. Get help right away if:  You develop a severe headache or confusion.  You have unusual weakness or numbness.  You feel faint.  You have severe pain in your chest or abdomen.  You vomit repeatedly.  You have trouble breathing. Summary  Hypertension is when the force of blood pumping through your arteries is too strong. If this condition is not controlled, it may put you at risk for serious complications.  Your personal target blood pressure may vary depending on your  medical conditions, your age, and other factors. For most people, a normal blood pressure is less than 120/80.  Hypertension is treated with lifestyle changes, medicines, or a combination of both. Lifestyle changes include weight loss, eating a healthy, low-sodium diet, exercising more, and limiting alcohol. This information is not intended to replace advice given to you by your health care provider. Make sure you discuss any questions you have with your health care provider. Document Released: 02/17/2005 Document Revised: 01/16/2016 Document Reviewed: 01/16/2016 Elsevier Interactive Patient Education  [employer]2018 Elsevier Inc.

## 2017-02-06 LAB — COMPREHENSIVE METABOLIC PANEL
A/G RATIO: 2 (ref 1.2–2.2)
ALK PHOS: 76 IU/L (ref 39–117)
ALT: 11 IU/L (ref 0–32)
AST: 16 IU/L (ref 0–40)
Albumin: 4.7 g/dL (ref 3.5–5.5)
BUN/Creatinine Ratio: 15 (ref 9–23)
BUN: 14 mg/dL (ref 6–24)
Bilirubin Total: <0.2 mg/dL (ref 0.0–1.2)
CO2: 22 mmol/L (ref 20–29)
CREATININE: 0.94 mg/dL (ref 0.57–1.00)
Calcium: 9.6 mg/dL (ref 8.7–10.2)
Chloride: 105 mmol/L (ref 96–106)
GFR calc Af Amer: 87 mL/min/{1.73_m2} (ref >59)
GFR calc non Af Amer: 76 mL/min/{1.73_m2} (ref >59)
Globulin, Total: 2.4 g/dL (ref 1.5–4.5)
Glucose: 77 mg/dL (ref 65–99)
POTASSIUM: 4.9 mmol/L (ref 3.5–5.2)
SODIUM: 140 mmol/L (ref 134–144)
Total Protein: 7.1 g/dL (ref 6.0–8.5)

## 2017-02-06 LAB — CBC
HEMATOCRIT: 42.8 % (ref 34.0–46.6)
Hemoglobin: 14.1 g/dL (ref 11.1–15.9)
MCH: 29.5 pg (ref 26.6–33.0)
MCHC: 32.9 g/dL (ref 31.5–35.7)
MCV: 90 fL (ref 79–97)
PLATELETS: 320 10*3/uL (ref 150–379)
RBC: 4.78 x10E6/uL (ref 3.77–5.28)
RDW: 12.6 % (ref 12.3–15.4)
WBC: 10.3 10*3/uL (ref 3.4–10.8)

## 2017-04-15 ENCOUNTER — Other Ambulatory Visit: Payer: Self-pay

## 2017-04-15 ENCOUNTER — Ambulatory Visit: Payer: BLUE CROSS/BLUE SHIELD | Admitting: Physician Assistant

## 2017-04-15 ENCOUNTER — Encounter: Payer: Self-pay | Admitting: Physician Assistant

## 2017-04-15 VITALS — BP 142/98 | HR 87 | Temp 98.1°F | Resp 14 | Ht 63.0 in | Wt 174.4 lb

## 2017-04-15 DIAGNOSIS — J019 Acute sinusitis, unspecified: Secondary | ICD-10-CM

## 2017-04-15 MED ORDER — PREDNISONE 20 MG PO TABS: ORAL_TABLET | ORAL | 0 refills | Status: DC

## 2017-04-15 MED ORDER — FLUCONAZOLE 150 MG PO TABS: 150.0000 mg | ORAL_TABLET | Freq: Once | ORAL | 0 refills | Status: AC

## 2017-04-15 MED ORDER — CEFDINIR 300 MG PO CAPS: 300.0000 mg | ORAL_CAPSULE | Freq: Two times a day (BID) | ORAL | 0 refills | Status: DC

## 2017-04-15 NOTE — Progress Notes (Signed)
Patient ID: Veronica Armstrong MRN: [id number], DOB: Jul 30, 1975, 42 y.o. Date of Encounter: 04/15/2017, 9:23 AM    Chief Complaint:  Chief Complaint  Patient presents with  . head cold    x3weeks  . Cough  . Nasal Congestion     HPI: 42 y.o. year old female presents with above.   States that she has been sick for about 3 weeks now.  A lot of head and nasal congestion and also feeling pain in her forehead/frontal sinus region and both of her cheeks/maxillary sinus regions.  Says that her ears feel stopped up and feels like she is in a barrel.  Now recently is also started with some cough.  To the point that she knew she had to come in.  Had no known fevers or chills.  No significant sore throat.     Home Meds:   Outpatient Medications Prior to Visit  Medication Sig Dispense Refill  . cetirizine (ZYRTEC) 10 MG tablet Take 10 mg by mouth daily.    . Cholecalciferol (VITAMIN D-1000 MAX ST) 1000 units tablet Take 1,000 Units by mouth daily.     . Cyanocobalamin (RA VITAMIN B-12 TR) 1000 MCG TBCR Take by mouth.    . fluticasone (FLONASE) 50 MCG/ACT nasal spray SPRAY TWICE IN EACH NOSTRIL ONCE DAILY  12  . metoprolol succinate (TOPROL-XL) 25 MG 24 hr tablet Take 1 tablet (25 mg total) by mouth daily. 90 tablet 0  . norethindrone-ethinyl estradiol (MICROGESTIN,JUNEL,LOESTRIN) 1-20 MG-MCG tablet TAKE 1 TABLET BY MOUTH DAILY. ACTIVE TABLETS ONLY  4  . hydroxychloroquine (PLAQUENIL) 200 MG tablet Take 200 mg by mouth daily.     . meloxicam (MOBIC) 15 MG tablet Take 15 mg by mouth daily.     . meloxicam (MOBIC) 15 MG tablet TAKE 1 TABLET BY MOUTH ONCE DAILY.     No facility-administered medications prior to visit.     Allergies:  Allergies  Allergen Reactions  . Promethazine Other (See Comments)    Seizure  . Erythromycin Hives  . Hydrocodone Itching      Review of Systems: See HPI for pertinent ROS. All other ROS negative.    Physical Exam: Blood pressure (!) 142/98, pulse  87, temperature 98.1 F (36.7 C), temperature source Oral, resp. rate 14, height 5\' 3"  (1.6 m), weight 79.1 kg (174 lb 6.4 oz), SpO2 98 %., Body mass index is 30.89 kg/m. General:  WNWD WF. Appears in no acute distress. HEENT: Normocephalic, atraumatic, eyes without discharge, sclera non-icteric, nares are without discharge. Bilateral auditory canals clear, TM's are without perforation. TMs slightly dull, left TM with some inflammation. Oral cavity moist, posterior pharynx without exudate, erythema, peritonsillar abscess.  Positive tenderness with percussion to frontal and maxillary sinuses bilaterally.  Neck: Supple. No thyromegaly. No lymphadenopathy. Lungs: Clear bilaterally to auscultation without wheezes, rales, or rhonchi. Breathing is unlabored. Heart: Regular rhythm. No murmurs, rubs, or gallops. Msk:  Strength and tone normal for age. Extremities/Skin: Warm and dry.  Neuro: Alert and oriented X 3. Moves all extremities spontaneously. Gait is normal. CNII-XII grossly in tact. Psych:  Responds to questions appropriately with a normal affect.     ASSESSMENT AND PLAN:  42 y.o. year old female with  1. Acute non-recurrent sinusitis, unspecified location She requests a Diflucan to have available in case antibiotic causes yeast infection.  Given the duration of antibiotic for 2 weeks I am giving her 2 Diflucan to have available in case she needs both.  To  take the antibiotic and the prednisone taper as directed.  Follow-up if symptoms do not resolve after completion of these.  Offered work note but she states that she does not need any note for work. - cefdinir (OMNICEF) 300 MG capsule; Take 1 capsule (300 mg total) by mouth 2 (two) times daily.  Dispense: 28 capsule; Refill: 0 - predniSONE (DELTASONE) 20 MG tablet; Take 3 daily for 2 days, then 2 daily for 2 days, then 1 daily for 2 days.  Dispense: 12 tablet; Refill: 0 - fluconazole (DIFLUCAN) 150 MG tablet; Take 1 tablet (150 mg total) by  mouth once for 1 dose. May repeat in 1 week if needed.  Dispense: 2 tablet; Refill: 0   Signed, [address]Mary Beth CashionDixon, [place], [facility] 04/15/2017 9:23 AM

## 2017-04-21 ENCOUNTER — Telehealth: Payer: Self-pay | Admitting: Family Medicine

## 2017-04-21 NOTE — Telephone Encounter (Signed)
Levaquin 750mg 1 po QD x 7 days. # 7 + 0. 

## 2017-04-21 NOTE — Telephone Encounter (Signed)
[facility]  Patient calling to say she was in last week to see Shon Halemary beth, she was prescribed a med and she thinks she had allergic rxn to this, so she quit taking it Saturday night, she now cannot hear our of her ear and is very congested, can something else be called in for her, or does she need a new ov?

## 2017-04-21 NOTE — Telephone Encounter (Signed)
See message below, Call placed to patient she states she had itching from head to toe and stopped taking the medication Saturday night

## 2017-04-22 MED ORDER — LEVOFLOXACIN 750 MG PO TABS: 750.0000 mg | ORAL_TABLET | Freq: Every day | ORAL | 0 refills | Status: DC

## 2017-04-22 NOTE — Telephone Encounter (Signed)
Message sent to patient via My Chart regarding the medication change also called and [email or web address]

## 2017-05-07 ENCOUNTER — Ambulatory Visit: Payer: BLUE CROSS/BLUE SHIELD | Admitting: Adult Health

## 2017-05-11 ENCOUNTER — Ambulatory Visit: Payer: BLUE CROSS/BLUE SHIELD | Admitting: Medical

## 2017-05-11 ENCOUNTER — Encounter: Payer: Self-pay | Admitting: Medical

## 2017-05-11 VITALS — BP 140/95 | HR 94 | Temp 99.2°F | Resp 18 | Ht 63.0 in | Wt 176.0 lb

## 2017-05-11 DIAGNOSIS — R059 Cough, unspecified: Secondary | ICD-10-CM

## 2017-05-11 DIAGNOSIS — J0101 Acute recurrent maxillary sinusitis: Secondary | ICD-10-CM

## 2017-05-11 DIAGNOSIS — B373 Candidiasis of vulva and vagina: Secondary | ICD-10-CM

## 2017-05-11 DIAGNOSIS — R05 Cough: Secondary | ICD-10-CM

## 2017-05-11 DIAGNOSIS — B3731 Acute candidiasis of vulva and vagina: Secondary | ICD-10-CM

## 2017-05-11 MED ORDER — AMOXICILLIN-POT CLAVULANATE 875-125 MG PO TABS: 1.0000 | ORAL_TABLET | Freq: Two times a day (BID) | ORAL | 0 refills | Status: DC

## 2017-05-11 MED ORDER — FLUCONAZOLE 150 MG PO TABS: 150.0000 mg | ORAL_TABLET | Freq: Once | ORAL | 0 refills | Status: AC

## 2017-05-11 MED ORDER — BENZONATATE 100 MG PO CAPS: 100.0000 mg | ORAL_CAPSULE | Freq: Three times a day (TID) | ORAL | 0 refills | Status: DC | PRN

## 2017-05-11 NOTE — Progress Notes (Signed)
Subjective:    Patient ID: Veronica Armstrong, female    DOB: [birth year], 42 y.o.   MRN: [id number]  HPI 42 yo female in non acute distress her today for Sinus symptoms since 03/23/17. Treated with Levaquin ( 2nd antibiotic)  And Steroids, still feels sick. Feels like allergies have also started. Started Allegra last week. Feels now the infectiont is down into her chest. Coughing productive yellow. Some occasional wheezing, denies fever chills or shortness or chest pain.   Review of Systems  Constitutional: Negative for chills and fever.  HENT: Positive for congestion and ear pain (ear fullness). Negative for ear discharge.   Respiratory: Positive for cough (yellowish) and wheezing. Negative for chest tightness and shortness of breath.   Cardiovascular: Negative for chest pain.  Gastrointestinal: Negative for abdominal pain.  Genitourinary: Negative for dysuria.  Musculoskeletal: Negative for myalgias.  Skin: Negative for rash.  Allergic/Immunologic: Positive for environmental allergies and food allergies (pineapple).  Neurological: Positive for headaches (sinue headache between eyes and cheeks). Negative for dizziness, syncope and light-headedness.  Hematological: Negative for adenopathy.  Psychiatric/Behavioral: Negative for behavioral problems, self-injury and suicidal ideas. The patient is not nervous/anxious.        Objective:   Physical Exam  Constitutional: She is oriented to person, place, and time. She appears well-developed and well-nourished.  HENT:  Head: Normocephalic and atraumatic.  Right Ear: Hearing, external ear and ear canal normal. A middle ear effusion is present.  Left Ear: Hearing, external ear and ear canal normal. A middle ear effusion is present.  Nose: Mucosal edema present.  Mouth/Throat: Uvula is midline, oropharynx is clear and moist and mucous membranes are normal.  Eyes: Conjunctivae and EOM are normal. Pupils are equal, round, and reactive to light.   Neck: Normal range of motion. Neck supple.  Cardiovascular: Normal rate, regular rhythm and normal heart sounds.  Pulmonary/Chest: Effort normal. She has wheezes.  Lymphadenopathy:    She has cervical adenopathy.  Neurological: She is alert and oriented to person, place, and time.  Skin: Skin is warm and dry.  Psychiatric: She has a normal mood and affect. Her behavior is normal. Judgment and thought content normal.  Nursing note and vitals reviewed.  Cough noted in room. Left turbinate swelling and erythema Mild inspriatory wheeze left upper lung.    Assessment & Plan:  Continued Sinusitis since January treated last time with (Levaquin and steroids) and now with Upper Respiratory Infection, cough. She has an Albuterol MDI at home and we reviewed how to use the inhaler. No known allergy to Amoxil or Augmentin has taken previously. Reviewed with patient when to call 911 and if any problems with the antibiotic to contact me./ office. Sees Dr. Willeen Cass. But has not seen him in 2 years. Referral made. Recommended staying on Allegra and try adding in  OTC Flonase take as directed. Meds ordered this encounter  Medications  . amoxicillin-clavulanate (AUGMENTIN) 875-125 MG tablet    Sig: Take 1 tablet by mouth 2 (two) times daily.    Dispense:  20 tablet    Refill:  0  . fluconazole (DIFLUCAN) 150 MG tablet    Sig: Take 1 tablet (150 mg total) by mouth once for 1 dose.    Dispense:  1 tablet    Refill:  0  . benzonatate (TESSALON) 100 MG capsule    Sig: Take 1 capsule (100 mg total) by mouth 3 (three) times daily as needed for cough. Swallow whole.    Dispense:  30 capsule    Refill:  0   Return to the clinic as needed. Contact me as needed.

## 2017-05-11 NOTE — Patient Instructions (Signed)
Upper Respiratory Infection, Adult Most upper respiratory infections (URIs) are caused by a virus. A URI affects the nose, throat, and upper air passages. The most common type of URI is often called "the common cold." Follow these instructions at home:  Take medicines only as told by your doctor.  Gargle warm saltwater or take cough drops to comfort your throat as told by your doctor.  Use a warm mist humidifier or inhale steam from a shower to increase air moisture. This may make it easier to breathe.  Drink enough fluid to keep your pee (urine) clear or pale yellow.  Eat soups and other clear broths.  Have a healthy diet.  Rest as needed.  Go back to work when your fever is gone or your doctor says it is okay. ? You may need to stay home longer to avoid giving your URI to others. ? You can also wear a face mask and wash your hands often to prevent spread of the virus.  Use your inhaler more if you have asthma.  Do not use any tobacco products, including cigarettes, chewing tobacco, or electronic cigarettes. If you need help quitting, ask your doctor. Contact a doctor if:  You are getting worse, not better.  Your symptoms are not helped by medicine.  You have chills.  You are getting more short of breath.  You have brown or red mucus.  You have yellow or brown discharge from your nose.  You have pain in your face, especially when you bend forward.  You have a fever.  You have puffy (swollen) neck glands.  You have pain while swallowing.  You have white areas in the back of your throat. Get help right away if:  You have very bad or constant: ? Headache. ? Ear pain. ? Pain in your forehead, behind your eyes, and over your cheekbones (sinus pain). ? Chest pain.  You have long-lasting (chronic) lung disease and any of the following: ? Wheezing. ? Long-lasting cough. ? Coughing up blood. ? A change in your usual mucus.  You have a stiff neck.  You have  changes in your: ? Vision. ? Hearing. ? Thinking. ? Mood. This information is not intended to replace advice given to you by your health care provider. Make sure you discuss any questions you have with your health care provider. Document Released: 08/06/2007 Document Revised: 10/21/2015 Document Reviewed: 05/25/2013 Elsevier Interactive Patient Education  2018 [employer] Sinusitis, Adult Sinusitis is soreness and inflammation of your sinuses. Sinuses are hollow spaces in the bones around your face. They are located:  Around your eyes.  In the middle of your forehead.  Behind your nose.  In your cheekbones.  Your sinuses and nasal passages are lined with a stringy fluid (mucus). Mucus normally drains out of your sinuses. When your nasal tissues get inflamed or swollen, the mucus can get trapped or blocked so air cannot flow through your sinuses. This lets bacteria, viruses, and funguses grow, and that leads to infection. Follow these instructions at home: Medicines  Take, use, or apply over-the-counter and prescription medicines only as told by your doctor. These may include nasal sprays.  If you were prescribed an antibiotic medicine, take it as told by your doctor. Do not stop taking the antibiotic even if you start to feel better. Hydrate and Humidify  Drink enough water to keep your pee (urine) clear or pale yellow.  Use a cool mist humidifier to keep the humidity level in your home above   50%.  Breathe in steam for 10-15 minutes, 3-4 times a day or as told by your doctor. You can do this in the bathroom while a hot shower is running.  Try not to spend time in cool or dry air. Rest  Rest as much as possible.  Sleep with your head raised (elevated).  Make sure to get enough sleep each night. General instructions  Put a warm, moist washcloth on your face 3-4 times a day or as told by your doctor. This will help with discomfort.  Wash your hands often with soap and  water. If there is no soap and water, use hand sanitizer.  Do not smoke. Avoid being around people who are smoking (secondhand smoke).  Keep all follow-up visits as told by your doctor. This is important. Contact a doctor if:  You have a fever.  Your symptoms get worse.  Your symptoms do not get better within 10 days. Get help right away if:  You have a very bad headache.  You cannot stop throwing up (vomiting).  You have pain or swelling around your face or eyes.  You have trouble seeing.  You feel confused.  Your neck is stiff.  You have trouble breathing. This information is not intended to replace advice given to you by your health care provider. Make sure you discuss any questions you have with your health care provider. Document Released: 08/06/2007 Document Revised: 10/14/2015 Document Reviewed: 12/13/2014 Elsevier Interactive Patient Education  2018 [employer]  

## 2017-05-15 ENCOUNTER — Encounter: Payer: Self-pay | Admitting: Family Medicine

## 2017-05-15 ENCOUNTER — Ambulatory Visit: Payer: BLUE CROSS/BLUE SHIELD | Admitting: Family Medicine

## 2017-05-15 VITALS — BP 130/98 | HR 88 | Temp 98.0°F | Resp 14 | Ht 63.0 in | Wt 176.0 lb

## 2017-05-15 DIAGNOSIS — J329 Chronic sinusitis, unspecified: Secondary | ICD-10-CM | POA: Diagnosis not present

## 2017-05-15 DIAGNOSIS — I10 Essential (primary) hypertension: Secondary | ICD-10-CM

## 2017-05-15 MED ORDER — AMLODIPINE BESYLATE 5 MG PO TABS: 5.0000 mg | ORAL_TABLET | Freq: Every day | ORAL | 3 refills | Status: DC

## 2017-05-15 MED ORDER — FLUTICASONE PROPIONATE 50 MCG/ACT NA SUSP: 2.0000 | Freq: Every day | NASAL | 6 refills | Status: DC

## 2017-05-15 MED ORDER — METOPROLOL SUCCINATE ER 25 MG PO TB24: 25.0000 mg | ORAL_TABLET | Freq: Every day | ORAL | 3 refills | Status: DC

## 2017-05-15 NOTE — Progress Notes (Signed)
Subjective:    Patient ID: Veronica Armstrong, female    DOB: 10/07/1975, 42 y.o.   MRN: [id number]  HPI Patient is a 42 year old white female who is actually been my friend most of my life.  She was diagnosed in her 43s with high blood pressure.  Originally thought to be a panic attack, her blood pressure remained elevated and she was started on hydrochlorothiazide.  She saw no benefit from hydrochlorothiazide and she was eventually switched to metoprolol XL 25 mg a day.  That helped some with anxiety and it also helps her blood pressure however she has never been able to achieve adequate control.  Her blood pressure tends to run similar to today in the 130s over 90s.  She denies any chest pain shortness of breath or dyspnea on exertion.  She does have a significant family history of a father who was diagnosed with coronary artery disease in his late 31s mid 88s and actually had a quadruple bypass at 58.  She has not had her cholesterol checked.  He is on oral contraceptive pills however her blood pressure is only moderately elevated.  She is also on antibiotics for her sinusitis.  She has had a sinus infection and been treated with antibiotics 3 times over the last several months with incomplete resolution.  She has an appointment to see an ear nose and throat doctor in 1 week for chronic sinusitis.  She is not taking her Flonase. No past medical history on file. No past surgical history on file. Current Outpatient Medications on File Prior to Visit  Medication Sig Dispense Refill  . amoxicillin-clavulanate (AUGMENTIN) 875-125 MG tablet Take 1 tablet by mouth 2 (two) times daily. 20 tablet 0  . benzonatate (TESSALON) 100 MG capsule Take 1 capsule (100 mg total) by mouth 3 (three) times daily as needed for cough. Swallow whole. 30 capsule 0  . buPROPion (WELLBUTRIN SR) 150 MG 12 hr tablet Take 1 tablet by mouth 2 (two) times daily.  2  . Cholecalciferol (VITAMIN D-1000 MAX ST) 1000 units tablet  Take 1,000 Units by mouth daily.     . Cyanocobalamin (RA VITAMIN B-12 TR) 1000 MCG TBCR Take by mouth.    . fluticasone (FLONASE) 50 MCG/ACT nasal spray SPRAY TWICE IN EACH NOSTRIL ONCE DAILY  12  . norethindrone-ethinyl estradiol (MICROGESTIN,JUNEL,LOESTRIN) 1-20 MG-MCG tablet TAKE 1 TABLET BY MOUTH DAILY. ACTIVE TABLETS ONLY  4   No current facility-administered medications on file prior to visit.    Allergies  Allergen Reactions  . Promethazine Other (See Comments)    Seizure  . Cefdinir Itching  . Erythromycin Hives  . Hydrocodone Itching   Social History   Socioeconomic History  . Marital status: Married    Spouse name: Not on file  . Number of children: Not on file  . Years of education: Not on file  . Highest education level: Not on file  Social Needs  . Financial resource strain: Not on file  . Food insecurity - worry: Not on file  . Food insecurity - inability: Not on file  . Transportation needs - medical: Not on file  . Transportation needs - non-medical: Not on file  Occupational History  . Not on file  Tobacco Use  . Smoking status: Never Smoker  . Smokeless tobacco: Never Used  Substance and Sexual Activity  . Alcohol use: Yes    Alcohol/week: 0.0 oz    Comment: Social  . Drug use: No  .  Sexual activity: Not on file  Other Topics Concern  . Not on file  Social History Narrative  . Not on file      Review of Systems  All other systems reviewed and are negative.      Objective:   Physical Exam  Constitutional: She appears well-developed and well-nourished.  HENT:  Nose: Mucosal edema and rhinorrhea present.  Cardiovascular: Normal rate, regular rhythm and normal heart sounds. Exam reveals no gallop and no friction rub.  No murmur heard. Pulmonary/Chest: Effort normal and breath sounds normal. No respiratory distress. She has no wheezes. She has no rales.  Abdominal: Soft. Bowel sounds are normal. She exhibits no distension. There is no  tenderness. There is no rebound and no guarding.  Musculoskeletal: She exhibits no edema.  Vitals reviewed.         Assessment & Plan:  Chronic sinusitis, unspecified location  Essential hypertension - Plan: metoprolol succinate (TOPROL-XL) 25 MG 24 hr tablet, Lipid panel  At this point, I think the patient would benefit from a CT scan of her sinuses to evaluate for chronic sinusitis.  She may need a prolonged course of antibiotics for up to 2-3 weeks with a prolonged steroid taper.  In the meantime I have recommended resuming Flonase 2 sprays each nostril once a day.  Continue Toprol-XL as I do believe anxiety is playing a large role in her blood pressure.  However I would also supplement with amlodipine 5 mg a day as the patient has a past medical history of Raynaud's phenomenon.  Recheck blood pressure in 1 month.  I reviewed lab work from December that was relatively normal.  I would like the patient to return fasting for a cholesterol at her earliest convenience given her family history.

## 2017-05-26 ENCOUNTER — Other Ambulatory Visit: Payer: BLUE CROSS/BLUE SHIELD

## 2017-05-26 DIAGNOSIS — I10 Essential (primary) hypertension: Secondary | ICD-10-CM | POA: Diagnosis not present

## 2017-05-26 LAB — LIPID PANEL
CHOL/HDL RATIO: 3.1 (calc) (ref <5.0)
CHOLESTEROL: 246 mg/dL — AB (ref <200)
HDL: 79 mg/dL (ref >50)
LDL Cholesterol (Calc): 142 mg/dL (calc) — ABNORMAL HIGH
NON-HDL CHOLESTEROL (CALC): 167 mg/dL — AB (ref <130)
Triglycerides: 130 mg/dL (ref <150)

## 2017-05-27 ENCOUNTER — Encounter: Payer: Self-pay | Admitting: Family Medicine

## 2017-05-27 DIAGNOSIS — J301 Allergic rhinitis due to pollen: Secondary | ICD-10-CM | POA: Diagnosis not present

## 2017-05-27 DIAGNOSIS — I10 Essential (primary) hypertension: Secondary | ICD-10-CM | POA: Insufficient documentation

## 2017-05-27 DIAGNOSIS — E785 Hyperlipidemia, unspecified: Secondary | ICD-10-CM | POA: Insufficient documentation

## 2017-05-27 DIAGNOSIS — J329 Chronic sinusitis, unspecified: Secondary | ICD-10-CM | POA: Diagnosis not present

## 2017-06-16 ENCOUNTER — Encounter: Payer: Self-pay | Admitting: Family Medicine

## 2017-06-18 DIAGNOSIS — J301 Allergic rhinitis due to pollen: Secondary | ICD-10-CM | POA: Diagnosis not present

## 2017-09-09 ENCOUNTER — Other Ambulatory Visit: Payer: Self-pay | Admitting: Family Medicine

## 2017-10-19 DIAGNOSIS — Z1231 Encounter for screening mammogram for malignant neoplasm of breast: Secondary | ICD-10-CM | POA: Diagnosis not present

## 2017-10-19 DIAGNOSIS — Z1151 Encounter for screening for human papillomavirus (HPV): Secondary | ICD-10-CM | POA: Diagnosis not present

## 2017-10-19 DIAGNOSIS — Z6830 Body mass index (BMI) 30.0-30.9, adult: Secondary | ICD-10-CM | POA: Diagnosis not present

## 2017-10-19 DIAGNOSIS — Z01419 Encounter for gynecological examination (general) (routine) without abnormal findings: Secondary | ICD-10-CM | POA: Diagnosis not present

## 2018-01-14 DIAGNOSIS — M7581 Other shoulder lesions, right shoulder: Secondary | ICD-10-CM | POA: Diagnosis not present

## 2018-02-08 ENCOUNTER — Ambulatory Visit: Payer: BLUE CROSS/BLUE SHIELD | Admitting: Medical

## 2018-02-08 ENCOUNTER — Encounter: Payer: Self-pay | Admitting: Medical

## 2018-02-08 VITALS — BP 152/81 | HR 103 | Temp 99.4°F | Resp 18 | Wt 178.0 lb

## 2018-02-08 DIAGNOSIS — J069 Acute upper respiratory infection, unspecified: Secondary | ICD-10-CM

## 2018-02-08 DIAGNOSIS — R059 Cough, unspecified: Secondary | ICD-10-CM

## 2018-02-08 DIAGNOSIS — R05 Cough: Secondary | ICD-10-CM

## 2018-02-08 DIAGNOSIS — H6502 Acute serous otitis media, left ear: Secondary | ICD-10-CM

## 2018-02-08 DIAGNOSIS — Z8619 Personal history of other infectious and parasitic diseases: Secondary | ICD-10-CM

## 2018-02-08 DIAGNOSIS — J0110 Acute frontal sinusitis, unspecified: Secondary | ICD-10-CM

## 2018-02-08 MED ORDER — FLUCONAZOLE 150 MG PO TABS: 150.0000 mg | ORAL_TABLET | Freq: Once | ORAL | 0 refills | Status: AC

## 2018-02-08 MED ORDER — AMOXICILLIN-POT CLAVULANATE 875-125 MG PO TABS: 1.0000 | ORAL_TABLET | Freq: Two times a day (BID) | ORAL | 0 refills | Status: DC

## 2018-02-08 MED ORDER — BENZONATATE 100 MG PO CAPS: 100.0000 mg | ORAL_CAPSULE | Freq: Three times a day (TID) | ORAL | 0 refills | Status: DC | PRN

## 2018-02-08 NOTE — Progress Notes (Addendum)
Patient sent a MyChart note asking for a second Diflucan. She also stopped the antibiotic on Friday due to diarrhea, she was taking Augmentin. Added to Allergies due to sensitivity to medication.  Subjective:    Patient ID: Veronica Armstrong, female    DOB: 1975-05-25, 42 y.o.   MRN: [id number]  HPI 42 yo female in non acute distress. Stared with nasal congestion and  Cough productive yellow , both ears painful left more than right. Deatra Jamesoe also has sinus pain and pressure in cheeks. Stayed home last Tuesday from work. Advil 400mg  7am. No fevers, denies shortness of breath or chest pain. Taking Sudafed and plain Mucinex. Takes Allegra when needed.  Blood pressure (!) 152/81, pulse (!) 103, temperature 99.4 F (37.4 C), temperature source Tympanic, resp. rate 18, weight 178 lb (80.7 kg), SpO2 100 %.  Allergies  Allergen Reactions  . Promethazine Other (See Comments)    Seizure  . Cefdinir Itching  . Erythromycin Hives  . Hydrocodone Itching  . Other     Pineapple , hives    Review of Systems  Constitutional: Positive for fatigue. Negative for chills and fever.  HENT: Positive for congestion, ear pain, postnasal drip, rhinorrhea, sinus pressure, sinus pain, sneezing, sore throat and voice change (hoarse). Negative for ear discharge and tinnitus.   Eyes: Negative for discharge and itching.  Respiratory: Positive for cough and wheezing. Negative for chest tightness and shortness of breath.   Cardiovascular: Negative for chest pain, palpitations and leg swelling.  Gastrointestinal: Negative for abdominal pain, diarrhea, nausea and vomiting.  Endocrine: Negative for polydipsia, polyphagia and polyuria.  Genitourinary: Negative for dysuria.  Musculoskeletal: Negative for myalgias.  Skin: Negative for rash.  Allergic/Immunologic: Positive for environmental allergies and food allergies.  Neurological: Positive for headaches (forehead). Negative for dizziness.  Hematological: Negative for  adenopathy.  Psychiatric/Behavioral: Negative for behavioral problems, self-injury and suicidal ideas.       Objective:   Physical Exam  Constitutional: She is oriented to person, place, and time. She appears well-developed and well-nourished.  HENT:  Head: Normocephalic and atraumatic.  Right Ear: Hearing, external ear and ear canal normal. A middle ear effusion is present.  Left Ear: Hearing, external ear and ear canal normal. Tympanic membrane is erythematous. A middle ear effusion is present.  Nose: Right sinus exhibits maxillary sinus tenderness. Left sinus exhibits maxillary sinus tenderness.  Mouth/Throat: Uvula is midline and mucous membranes are normal. Posterior oropharyngeal erythema (mild) present. Tonsils are 2+ on the right. Tonsils are 2+ on the left.  Eyes: Pupils are equal, round, and reactive to light. Conjunctivae and EOM are normal.  Neck: Normal range of motion. Neck supple.  Cardiovascular: Normal rate, regular rhythm and normal heart sounds.  Pulmonary/Chest: Effort normal and breath sounds normal. No stridor. No respiratory distress. She has no wheezes. She has no rales.  Lymphadenopathy:    She has cervical adenopathy.  Neurological: She is alert and oriented to person, place, and time.  Skin: Skin is warm and dry.  Psychiatric: She has a normal mood and affect. Her behavior is normal. Judgment and thought content normal.  Nursing note and vitals reviewed.   Cough in room.  Assessment & Plan:  Upper Respiratory Infection  And Sinusitis ( which looks viral) Otitis Media Left ear Cough  And Sinusitis ( which looks viral) Has Albuterol MDI at home use if needed for shortness of breath or wheezing.. Patient has been on Augmentin before with good results and no allergic reactions, would  also  like a Diflucan pill. To take after finishing antibiotics, may use OTC Monistat use per package directions. To stop Sudafed and avoid decongestant other than Mucinex due to  history of hypertension. Meds ordered this encounter  Medications  . amoxicillin-clavulanate (AUGMENTIN) 875-125 MG tablet    Sig: Take 1 tablet by mouth 2 (two) times daily.    Dispense:  20 tablet    Refill:  0  . benzonatate (TESSALON PERLES) 100 MG capsule    Sig: Take 1 capsule (100 mg total) by mouth 3 (three) times daily as needed.    Dispense:  30 capsule    Refill:  0  . fluconazole (DIFLUCAN) 150 MG tablet    Sig: Take 1 tablet (150 mg total) by mouth once for 1 dose.    Dispense:  1 tablet    Refill:  0  Rest and incsease fluids, may also take plain Allegra to help with post nasal drip. Return in 3-5 days if not improving. Patient verbalizes understanding and has no questions at discharge.

## 2018-02-14 ENCOUNTER — Encounter: Payer: Self-pay | Admitting: Medical

## 2018-02-15 MED ORDER — FLUCONAZOLE 150 MG PO TABS: 150.0000 mg | ORAL_TABLET | Freq: Once | ORAL | 0 refills | Status: AC

## 2018-02-15 NOTE — Addendum Note (Signed)
Addended by: Cyann Venti, Herbert SetaHEATHER R on: 02/15/2018 08:18 AM   Modules accepted: Orders

## 2018-03-10 DIAGNOSIS — L578 Other skin changes due to chronic exposure to nonionizing radiation: Secondary | ICD-10-CM | POA: Diagnosis not present

## 2018-03-10 DIAGNOSIS — D2230 Melanocytic nevi of unspecified part of face: Secondary | ICD-10-CM | POA: Diagnosis not present

## 2018-03-10 DIAGNOSIS — Z1283 Encounter for screening for malignant neoplasm of skin: Secondary | ICD-10-CM | POA: Diagnosis not present

## 2018-03-10 DIAGNOSIS — D485 Neoplasm of uncertain behavior of skin: Secondary | ICD-10-CM | POA: Diagnosis not present

## 2018-03-13 DIAGNOSIS — M25511 Pain in right shoulder: Secondary | ICD-10-CM | POA: Diagnosis not present

## 2018-03-15 DIAGNOSIS — M7581 Other shoulder lesions, right shoulder: Secondary | ICD-10-CM | POA: Diagnosis not present

## 2018-03-18 DIAGNOSIS — M25511 Pain in right shoulder: Secondary | ICD-10-CM | POA: Diagnosis not present

## 2018-03-18 DIAGNOSIS — S46011D Strain of muscle(s) and tendon(s) of the rotator cuff of right shoulder, subsequent encounter: Secondary | ICD-10-CM | POA: Diagnosis not present

## 2018-03-18 DIAGNOSIS — M25611 Stiffness of right shoulder, not elsewhere classified: Secondary | ICD-10-CM | POA: Diagnosis not present

## 2018-03-18 DIAGNOSIS — M7541 Impingement syndrome of right shoulder: Secondary | ICD-10-CM | POA: Diagnosis not present

## 2018-03-25 DIAGNOSIS — M25611 Stiffness of right shoulder, not elsewhere classified: Secondary | ICD-10-CM | POA: Diagnosis not present

## 2018-03-25 DIAGNOSIS — S46011D Strain of muscle(s) and tendon(s) of the rotator cuff of right shoulder, subsequent encounter: Secondary | ICD-10-CM | POA: Diagnosis not present

## 2018-03-25 DIAGNOSIS — M25511 Pain in right shoulder: Secondary | ICD-10-CM | POA: Diagnosis not present

## 2018-03-25 DIAGNOSIS — M7541 Impingement syndrome of right shoulder: Secondary | ICD-10-CM | POA: Diagnosis not present

## 2018-03-26 DIAGNOSIS — M7672 Peroneal tendinitis, left leg: Secondary | ICD-10-CM | POA: Diagnosis not present

## 2018-05-14 ENCOUNTER — Other Ambulatory Visit: Payer: Self-pay | Admitting: *Deleted

## 2018-05-14 DIAGNOSIS — I10 Essential (primary) hypertension: Secondary | ICD-10-CM

## 2018-05-14 MED ORDER — METOPROLOL SUCCINATE ER 25 MG PO TB24: 25.0000 mg | ORAL_TABLET | Freq: Every day | ORAL | 3 refills | Status: DC

## 2018-05-14 MED ORDER — AMLODIPINE BESYLATE 5 MG PO TABS: 5.0000 mg | ORAL_TABLET | Freq: Every day | ORAL | 2 refills | Status: DC

## 2018-05-21 DIAGNOSIS — Z6830 Body mass index (BMI) 30.0-30.9, adult: Secondary | ICD-10-CM | POA: Diagnosis not present

## 2018-05-21 DIAGNOSIS — E669 Obesity, unspecified: Secondary | ICD-10-CM | POA: Diagnosis not present

## 2018-07-25 ENCOUNTER — Other Ambulatory Visit: Payer: Self-pay | Admitting: Family Medicine

## 2018-11-30 ENCOUNTER — Encounter: Payer: Self-pay | Admitting: Family Medicine

## 2018-11-30 DIAGNOSIS — M359 Systemic involvement of connective tissue, unspecified: Secondary | ICD-10-CM

## 2018-12-03 ENCOUNTER — Other Ambulatory Visit: Payer: Self-pay | Admitting: Family Medicine

## 2018-12-03 DIAGNOSIS — M359 Systemic involvement of connective tissue, unspecified: Secondary | ICD-10-CM

## 2018-12-09 DIAGNOSIS — N809 Endometriosis, unspecified: Secondary | ICD-10-CM | POA: Diagnosis not present

## 2018-12-09 DIAGNOSIS — F329 Major depressive disorder, single episode, unspecified: Secondary | ICD-10-CM | POA: Diagnosis not present

## 2018-12-09 DIAGNOSIS — Z01419 Encounter for gynecological examination (general) (routine) without abnormal findings: Secondary | ICD-10-CM | POA: Diagnosis not present

## 2018-12-09 DIAGNOSIS — Z1231 Encounter for screening mammogram for malignant neoplasm of breast: Secondary | ICD-10-CM | POA: Diagnosis not present

## 2018-12-09 DIAGNOSIS — Z6831 Body mass index (BMI) 31.0-31.9, adult: Secondary | ICD-10-CM | POA: Diagnosis not present

## 2018-12-17 NOTE — Progress Notes (Signed)
Office Visit Note  Patient: Veronica Armstrong             Date of Birth: 1975-06-18           MRN: [id number]             PCP: Donita Brooks, MD Referring: Donita Brooks, MD Visit Date: 12/31/2018 Occupation: Research scientist (medical) at [employer]  Subjective:  Joint pain, Raynaud's.   History of Present Illness: Veronica Armstrong is a 43 y.o. female seen in consultation per request of Dr. Gailen Shelter.  According to patient in her 30s she started having joint pain.  At the time she was also found to have positive ANA.  She was referred to Dr. Coral Spikes and at the time she was having mostly raynauds phenomenon.  She was started on Plaquenil which she took for about 1 year.  She stated did not help her and she discontinued the medication.  She was under care of Dr. Nickola Major next.  Who gave her prednisone tapers intermittently for joint swelling and continued on Plaquenil.  She states Plaquenil use was also intermittent.  She had been off Plaquenil for the last for 5 years.  She continues to have fatigue.  She continues to have joint pain which she describes in her shoulders, hip joints and knee joints.  She has persistent swelling of her left ankle joint.  She states her Raynaud's is most severe during the wintertime she will get digital ulcers.  It is limited to her feet.  She denies any history of miscarriages.  She is on Loestrin for birth control.  Activities of Daily Living:  Patient reports morning stiffness for 1 hour.   Patient Reports nocturnal pain.  Difficulty dressing/grooming: Denies Difficulty climbing stairs: Denies Difficulty getting out of chair: Denies Difficulty using hands for taps, buttons, cutlery, and/or writing: Reports  Review of Systems  Constitutional: Positive for fatigue. Negative for night sweats, weight gain and weight loss.  HENT: Negative for mouth sores, trouble swallowing, trouble swallowing, mouth dryness and nose dryness.   Eyes: Positive for dryness.  Negative for pain, redness and visual disturbance.  Respiratory: Negative for cough, shortness of breath and difficulty breathing.   Cardiovascular: Negative for chest pain, palpitations, hypertension, irregular heartbeat and swelling in legs/feet.  Gastrointestinal: Negative for blood in stool, constipation and diarrhea.  Endocrine: Negative for increased urination.  Genitourinary: Negative for difficulty urinating and vaginal dryness.  Musculoskeletal: Positive for arthralgias, joint pain, joint swelling and morning stiffness. Negative for myalgias, muscle weakness, muscle tenderness and myalgias.  Skin: Positive for color change. Negative for rash, hair loss, skin tightness, ulcers and sensitivity to sunlight.  Allergic/Immunologic: Negative for susceptible to infections.  Neurological: Negative for dizziness, memory loss, night sweats and weakness.  Hematological: Negative for bruising/bleeding tendency and swollen glands.  Psychiatric/Behavioral: Positive for depressed mood. Negative for sleep disturbance. The patient is nervous/anxious.     PMFS History:  Patient Active Problem List   Diagnosis Date Noted  . Benign essential HTN   . HLD (hyperlipidemia)   . Hypertension 02/05/2017  . Stress fracture, right foot, initial encounter for fracture 01/21/2016    Past Medical History:  Diagnosis Date  . Benign essential HTN   . HLD (hyperlipidemia)     Family History  Problem Relation Age of Onset  . Osteoporosis Mother   . Parkinson's disease Father   . Heart disease Father    Past Surgical History:  Procedure Laterality Date  . CESAREAN  SECTION    . LAPAROSCOPY    . NECK SURGERY     Social History   Social History Narrative  . Not on file   Immunization History  Administered Date(s) Administered  . Influenza,inj,Quad PF,6+ Mos 11/23/2017  . Influenza-Unspecified 12/23/2016     Objective: Vital Signs: BP 132/82 (BP Location: Right Arm, Patient Position: Sitting,  Cuff Size: Normal)   Pulse (!) 103   Resp 16   Ht 5\' 3"  (1.6 m)   Wt 179 lb 12.8 oz (81.6 kg)   BMI 31.85 kg/m    Physical Exam Vitals signs and nursing note reviewed.  Constitutional:      Appearance: She is well-developed.  HENT:     Head: Normocephalic and atraumatic.  Eyes:     Conjunctiva/sclera: Conjunctivae normal.  Neck:     Musculoskeletal: Normal range of motion.  Cardiovascular:     Rate and Rhythm: Normal rate and regular rhythm.     Heart sounds: Normal heart sounds.  Pulmonary:     Effort: Pulmonary effort is normal.     Breath sounds: Normal breath sounds.  Abdominal:     General: Bowel sounds are normal.     Palpations: Abdomen is soft.  Lymphadenopathy:     Cervical: No cervical adenopathy.  Skin:    General: Skin is warm and dry.     Capillary Refill: Capillary refill takes more than 3 seconds. Digital cyanosis in toes Neurological:     Mental Status: She is alert and oriented to person, place, and time.  Psychiatric:        Behavior: Behavior normal.      Musculoskeletal Exam: C-spine good range of motion.  Thoracic and lumbar spine with good range of motion.  She has been full range of motion of bilateral shoulder joints.  Elbow joints and wrist joints with good range of motion.  She had tenderness on palpation of bilateral second and third MCP joints with no synovitis.  Hip joints and knee joints in good range of motion.  She has warmth and swelling over left ankle joint.  She is some tenderness across MTPs but no synovitis was noted.  CDAI Exam: CDAI Score: - Patient Global: -; Provider Global: - Swollen: -; Tender: - Joint Exam   No joint exam has been documented for this visit   There is currently no information documented on the homunculus. Go to the Rheumatology activity and complete the homunculus joint exam.  Investigation: No additional findings.  Imaging: No results found.  Recent Labs: Lab Results  Component Value Date   WBC  10.3 02/05/2017   HGB 14.1 02/05/2017   PLT 320 02/05/2017   NA 140 02/05/2017   K 4.9 02/05/2017   CL 105 02/05/2017   CO2 22 02/05/2017   GLUCOSE 77 02/05/2017   BUN 14 02/05/2017   CREATININE 0.94 02/05/2017   BILITOT <0.2 02/05/2017   ALKPHOS 76 02/05/2017   AST 16 02/05/2017   ALT 11 02/05/2017   PROT 7.1 02/05/2017   ALBUMIN 4.7 02/05/2017   CALCIUM 9.6 02/05/2017   GFRAA 87 02/05/2017    Speciality Comments: No specialty comments available.  Procedures:  No procedures performed Allergies: Promethazine, Augmentin [amoxicillin-pot clavulanate], Cefdinir, Erythromycin, Hydrocodone, and Other   Assessment / Plan:     Visit Diagnoses: Connective tissue disorder (HCC) -patient was diagnosed with connective tissue disease by Dr. Coral SpikesLevitin several years ago.  She was also under care of Dr. Nickola MajorHawkes for many years.  Patient states  she was given prednisone as needed for joint swelling.  She was also given Plaquenil which she never took for long-term as she felt it was not effective.  She has been off Plaquenil for several years now.  She has persistent pain and swelling in her left ankle and arthralgias in multiple joints.- Plan: Urinalysis, Routine w reflex microscopic, CK, Sedimentation rate, ANA, Anti-scleroderma antibody, RNP Antibody, Anti-Smith antibody, Sjogrens syndrome-A extractable nuclear antibody, Sjogrens syndrome-B extractable nuclear antibody, Anti-DNA antibody, double-stranded, C3 and C4, Beta-2 glycoprotein antibodies, Cardiolipin antibodies, IgG, IgM, IgA, Lupus Anticoagulant Eval w/Reflex  Raynaud's disease without gangrene-she has severe Raynaud's in her toes.  She also gets digital ulcers.  She is currently on amlodipine which should help.  She is also on metoprolol which makes her Raynaud's worse.  I have advised her to discuss with Dr. Tanya Nones if she can switch over from beta-blocker to some other medication or increase the dose of amlodipine.  Keeping core temperature  warm and warm clothing was discussed.  Pain in both hands -she has tenderness over bilateral second and third MCP joints.  No synovitis was noted.  There is positive family history of rheumatoid arthritis in her first cousins.  Plan: XR Hand 2 View Right, XR Hand 2 View Left, x-ray of bilateral hands were within normal limits.  Rheumatoid factor, Cyclic citrul peptide antibody, IgG  Pain in both feet -she has discomfort in her feet but no synovitis was noted.  Plan: XR Foot 2 Views Right, XR Foot 2 Views Left.  X-ray of bilateral feet were unremarkable.  Pain in left ankle and joints of left foot -she had pain and swelling in her left ankle joint.  Plan: XR Ankle 2 Views Left.  X-ray of ankle joint was unremarkable.  High risk medication use -in anticipation to start her on more aggressive therapy I will obtain following labs today.  Plan: CBC with Differential/Platelet, COMPLETE METABOLIC PANEL WITH GFR, Hepatitis B core antibody, IgM, Hepatitis B surface antigen, Hepatitis C antibody, HIV Antibody (routine testing w rflx), QuantiFERON-TB Gold Plus, Serum protein electrophoresis with reflex, IgG, IgA, IgM, Glucose 6 phosphate dehydrogenase  Other fatigue -she is chronic fatigue.  Plan: VITAMIN D 25 Hydroxy (Vit-D Deficiency, Fractures)  Stress fracture, right foot, initial encounter for fracture - 2019 per patient.  She states it feels well.  Essential hypertension-she is on amlodipine and metoprolol.  History of hyperlipidemia  Anxiety and depression  Orders: Orders Placed This Encounter  Procedures  . XR Hand 2 View Right  . XR Hand 2 View Left  . XR Foot 2 Views Right  . XR Foot 2 Views Left  . XR Ankle 2 Views Left  . CBC with Differential/Platelet  . COMPLETE METABOLIC PANEL WITH GFR  . Urinalysis, Routine w reflex microscopic  . CK  . Sedimentation rate  . VITAMIN D 25 Hydroxy (Vit-D Deficiency, Fractures)  . Rheumatoid factor  . Cyclic citrul peptide antibody, IgG  . ANA   . Anti-scleroderma antibody  . RNP Antibody  . Anti-Smith antibody  . Sjogrens syndrome-A extractable nuclear antibody  . Sjogrens syndrome-B extractable nuclear antibody  . Anti-DNA antibody, double-stranded  . C3 and C4  . Beta-2 glycoprotein antibodies  . Cardiolipin antibodies, IgG, IgM, IgA  . Lupus Anticoagulant Eval w/Reflex  . Hepatitis B core antibody, IgM  . Hepatitis B surface antigen  . Hepatitis C antibody  . HIV Antibody (routine testing w rflx)  . QuantiFERON-TB Gold Plus  . Serum protein electrophoresis  with reflex  . IgG, IgA, IgM  . Glucose 6 phosphate dehydrogenase   No orders of the defined types were placed in this encounter.   Face-to-face time spent with patient was 45 minutes. Greater than 50% of time was spent in counseling and coordination of care.  Follow-Up Instructions: Return in about 2 weeks (around 01/14/2019) for Autoimmune disease.   Bo Merino, MD  Note - This record has been created using Editor, commissioning.  Chart creation errors have been sought, but may not always  have been located. Such creation errors do not reflect on  the standard of medical care.

## 2018-12-31 ENCOUNTER — Other Ambulatory Visit: Payer: Self-pay

## 2018-12-31 ENCOUNTER — Ambulatory Visit: Payer: Self-pay

## 2018-12-31 ENCOUNTER — Encounter: Payer: Self-pay | Admitting: Rheumatology

## 2018-12-31 ENCOUNTER — Ambulatory Visit: Payer: BC Managed Care – PPO | Admitting: Rheumatology

## 2018-12-31 VITALS — BP 132/82 | HR 103 | Resp 16 | Ht 63.0 in | Wt 179.8 lb

## 2018-12-31 DIAGNOSIS — F329 Major depressive disorder, single episode, unspecified: Secondary | ICD-10-CM

## 2018-12-31 DIAGNOSIS — M79641 Pain in right hand: Secondary | ICD-10-CM | POA: Diagnosis not present

## 2018-12-31 DIAGNOSIS — Z79899 Other long term (current) drug therapy: Secondary | ICD-10-CM | POA: Diagnosis not present

## 2018-12-31 DIAGNOSIS — R5383 Other fatigue: Secondary | ICD-10-CM | POA: Diagnosis not present

## 2018-12-31 DIAGNOSIS — F32A Depression, unspecified: Secondary | ICD-10-CM

## 2018-12-31 DIAGNOSIS — M79671 Pain in right foot: Secondary | ICD-10-CM

## 2018-12-31 DIAGNOSIS — I7300 Raynaud's syndrome without gangrene: Secondary | ICD-10-CM | POA: Diagnosis not present

## 2018-12-31 DIAGNOSIS — M79672 Pain in left foot: Secondary | ICD-10-CM | POA: Diagnosis not present

## 2018-12-31 DIAGNOSIS — M25572 Pain in left ankle and joints of left foot: Secondary | ICD-10-CM

## 2018-12-31 DIAGNOSIS — M359 Systemic involvement of connective tissue, unspecified: Secondary | ICD-10-CM | POA: Diagnosis not present

## 2018-12-31 DIAGNOSIS — Z1389 Encounter for screening for other disorder: Secondary | ICD-10-CM | POA: Diagnosis not present

## 2018-12-31 DIAGNOSIS — M79642 Pain in left hand: Secondary | ICD-10-CM | POA: Diagnosis not present

## 2018-12-31 DIAGNOSIS — M84374A Stress fracture, right foot, initial encounter for fracture: Secondary | ICD-10-CM

## 2018-12-31 DIAGNOSIS — I10 Essential (primary) hypertension: Secondary | ICD-10-CM

## 2018-12-31 DIAGNOSIS — Z8639 Personal history of other endocrine, nutritional and metabolic disease: Secondary | ICD-10-CM

## 2018-12-31 DIAGNOSIS — F419 Anxiety disorder, unspecified: Secondary | ICD-10-CM

## 2018-12-31 NOTE — Progress Notes (Signed)
Office Visit Note  Patient: Veronica Armstrong             Date of Birth: January 23, 1976           MRN: [id number]             PCP: Susy Frizzle, MD Referring: Susy Frizzle, MD Visit Date: 01/07/2019 Occupation: '@GUAROCC' @  Subjective:  Dry mouth and dry eyes.   History of Present Illness: Veronica Armstrong is a 43 y.o. female who was diagnosed with connective tissue disease by Dr. Rockwell Alexandria and Dr. Trudie Reed in the past.  She was treated with prednisone and Plaquenil for some time and then she came off the medication as she did not notice any improvement on the medication.  She has been off Plaquenil for a while.  She continues to have pain and discomfort in her joints.  She has swelling in her left ankle joint.  She also gets severe Raynaud's during the wintertime to the point she gets digital ulcers.  She has history of sicca symptoms with dry mouth and dry eyes.  Activities of Daily Living:  Patient reports morning stiffness for 1 hour.   Patient Reports nocturnal pain.  Difficulty dressing/grooming: Denies Difficulty climbing stairs: Denies Difficulty getting out of chair: Denies Difficulty using hands for taps, buttons, cutlery, and/or writing: Reports  Review of Systems  Constitutional: Positive for fatigue. Negative for night sweats, weight gain and weight loss.  HENT: Positive for mouth dryness. Negative for mouth sores, trouble swallowing, trouble swallowing and nose dryness.   Eyes: Positive for dryness. Negative for pain, redness and visual disturbance.  Respiratory: Negative for cough, shortness of breath and difficulty breathing.   Cardiovascular: Negative for chest pain, palpitations, hypertension, irregular heartbeat and swelling in legs/feet.  Gastrointestinal: Negative for blood in stool, constipation and diarrhea.  Endocrine: Negative for excessive thirst and increased urination.  Genitourinary: Negative for difficulty urinating and vaginal dryness.  Musculoskeletal:  Positive for arthralgias, joint pain and morning stiffness. Negative for joint swelling, myalgias, muscle weakness, muscle tenderness and myalgias.  Skin: Negative for color change, rash, hair loss, skin tightness, ulcers and sensitivity to sunlight.  Allergic/Immunologic: Negative for susceptible to infections.  Neurological: Negative for dizziness, memory loss, night sweats and weakness.  Hematological: Negative for bruising/bleeding tendency and swollen glands.  Psychiatric/Behavioral: Positive for sleep disturbance. Negative for depressed mood. The patient is not nervous/anxious.     PMFS History:  Patient Active Problem List   Diagnosis Date Noted   Benign essential HTN    HLD (hyperlipidemia)    Hypertension 02/05/2017   Stress fracture, right foot, initial encounter for fracture 01/21/2016    Past Medical History:  Diagnosis Date   Benign essential HTN    HLD (hyperlipidemia)     Family History  Problem Relation Age of Onset   Osteoporosis Mother    Parkinson's disease Father    Heart disease Father    Past Surgical History:  Procedure Laterality Date   CESAREAN SECTION     LAPAROSCOPY     NECK SURGERY     Social History   Social History Narrative   Not on file   Immunization History  Administered Date(s) Administered   Influenza,inj,Quad PF,6+ Mos 11/23/2017   Influenza-Unspecified 12/23/2016     Objective: Vital Signs: BP 122/75 (BP Location: Left Arm, Patient Position: Sitting, Cuff Size: Normal)    Pulse 90    Resp 14    Ht '5\' 3"'  (1.6 m)  Wt 177 lb (80.3 kg)    BMI 31.35 kg/m    Physical Exam Vitals signs and nursing note reviewed.  Constitutional:      Appearance: She is well-developed.  HENT:     Head: Normocephalic and atraumatic.  Eyes:     Conjunctiva/sclera: Conjunctivae normal.  Neck:     Musculoskeletal: Normal range of motion.  Cardiovascular:     Rate and Rhythm: Normal rate and regular rhythm.     Heart sounds: Normal  heart sounds.  Pulmonary:     Effort: Pulmonary effort is normal.     Breath sounds: Normal breath sounds.  Abdominal:     General: Bowel sounds are normal.     Palpations: Abdomen is soft.  Lymphadenopathy:     Cervical: No cervical adenopathy.  Skin:    General: Skin is warm and dry.     Capillary Refill: Capillary refill takes 2 to 3 seconds.  Neurological:     Mental Status: She is alert and oriented to person, place, and time.  Psychiatric:        Behavior: Behavior normal.      Musculoskeletal Exam: C-spine thoracic and lumbar spine with good range of motion.  Shoulder joints, elbow joints, wrist joints, MCPs PIPs DIPs with good range of motion with no synovitis.  Hip joints, knee joints, ankles, MTPs with good range of motion with no synovitis.  CDAI Exam: CDAI Score: -- Patient Global: --; Provider Global: -- Swollen: --; Tender: -- Joint Exam   No joint exam has been documented for this visit   There is currently no information documented on the homunculus. Go to the Rheumatology activity and complete the homunculus joint exam.  Investigation: No additional findings.  Imaging: Xr Ankle 2 Views Left  Result Date: 12/31/2018 No tibiotalar or subtalar joint space narrowing was noted.  No erosive changes were noted. Impression: Unremarkable x-ray of the ankle.  Xr Foot 2 Views Left  Result Date: 12/31/2018 No MCP, PIP or DIP narrowing was noted.  No intertarsal or tibiotalar joint space narrowing was noted.  No erosive changes were noted. Impression: Unremarkable x-ray of the foot.  Xr Foot 2 Views Right  Result Date: 12/31/2018 No MCP, PIP or DIP narrowing was noted.  No intertarsal or tibiotalar joint space narrowing was noted.  No erosive changes were noted. Impression: Unremarkable x-ray of the foot.  Xr Hand 2 View Left  Result Date: 12/31/2018 No MCP PIP or DIP narrowing was noted.  No intercarpal radiocarpal joint space narrowing was noted.  No erosive  changes were noted. Impression: Unremarkable x-ray of the hand.  Xr Hand 2 View Right  Result Date: 12/31/2018 No MCP PIP or DIP narrowing was noted.  No intercarpal radiocarpal joint space narrowing was noted.  No erosive changes were noted. Impression: Unremarkable x-ray of the hand.   Recent Labs: Lab Results  Component Value Date   WBC 8.2 12/31/2018   HGB 13.5 12/31/2018   PLT 298 12/31/2018   NA 139 12/31/2018   K 4.5 12/31/2018   CL 102 12/31/2018   CO2 21 12/31/2018   GLUCOSE 76 12/31/2018   BUN 18 12/31/2018   CREATININE 0.90 12/31/2018   BILITOT 0.3 12/31/2018   ALKPHOS 76 02/05/2017   AST 16 12/31/2018   ALT 16 12/31/2018   PROT 7.1 12/31/2018   PROT 7.1 12/31/2018   ALBUMIN 4.7 02/05/2017   CALCIUM 9.8 12/31/2018   GFRAA 91 12/31/2018   QFTBGOLDPLUS NEGATIVE 12/31/2018  UA-, SPEP  showed alpha-1 globulin increase, immunoglobulins normal, TB Gold negative, HIV negative, G6PD normal CK 60, vitamin D 49 ESR 11, lupus anticoagulant negative, beta-2 negative, anticardiolipin negative, C3-C4 normal, ENA negative except dsDNA 8 (indeterminate), ANA negative, RF negative, anti-CCP negative  Speciality Comments: No specialty comments available.  Procedures:  No procedures performed Allergies: Promethazine, Augmentin [amoxicillin-pot clavulanate], Cefdinir, Erythromycin, Hydrocodone, and Other   Assessment / Plan:     Visit Diagnoses: Connective tissue disorder ([facility]) - Previous patient of Dr. Linward Natal and Dr. Trudie Reed.  Treated with prednisone as needed.  She was also on Plaquenil for short time.  Double-stranded DNA indeterminate.  All other autoimmune work-up was negative.  Patient would like to try Plaquenil again.  She states she has been having pain and discomfort in all of her joints.  She also has sicca symptoms and severe Raynaud's in her feet.  Indications side effects contraindications were discussed.  Handout was given and consent was taken.  My plan is to start her  on Plaquenil 200 mg p.o.twice daily.  She will need baseline examination and then yearly eye examination.  We will check labs in a month, 3 months and then every 5 months.  Raynaud's disease without gangrene - Severe Raynaud's in her toes with history of digital ulcers.  Treated with amlodipine.  She will discuss coming off metoprolol with her PCP.  High risk medication use-she will need baseline eye examination.  Polyarthralgia - Pain in bilateral hands and bilateral feet.  No synovitis noted on exam.  She had tenderness over bilateral second and third MCPs.  X-rays were normal.  Pain in left ankle and joints of left foot - Pain and swelling in left ankle.  X-ray was unremarkable at the last visit.  Patient gives history of intermittent swelling.  No swelling was noted today on examination.  Other fatigue  Stress fracture, right foot, initial encounter for fracture - 2019 per patient.  Essential hypertension-her blood pressure is well controlled.  History of hyperlipidemia  Anxiety and depression  Orders: No orders of the defined types were placed in this encounter.  Meds ordered this encounter  Medications   hydroxychloroquine (PLAQUENIL) 200 MG tablet    Sig: Take 1 tablet (200 mg total) by mouth 2 (two) times daily.    Dispense:  60 tablet    Refill:  0    Face-to-face time spent with patient was 58mnutes. Greater than 50% of time was spent in counseling and coordination of care.  Follow-Up Instructions: Return in about 3 months (around 04/09/2019) for CTD.   SBo Merino MD  Note - This record has been created using DEditor, commissioning  Chart creation errors have been sought, but may not always  have been located. Such creation errors do not reflect on  the standard of medical care.

## 2019-01-06 LAB — IGG, IGA, IGM
IgG (Immunoglobin G), Serum: 953 mg/dL (ref 600–1640)
IgM, Serum: 66 mg/dL (ref 50–300)
Immunoglobulin A: 142 mg/dL (ref 47–310)

## 2019-01-06 LAB — ANTI-SMITH ANTIBODY: ENA SM Ab Ser-aCnc: <1 AI

## 2019-01-06 LAB — URINALYSIS, ROUTINE W REFLEX MICROSCOPIC
Bilirubin Urine: NEGATIVE
Glucose, UA: NEGATIVE
Hgb urine dipstick: NEGATIVE
Ketones, ur: NEGATIVE
Leukocytes,Ua: NEGATIVE
Nitrite: NEGATIVE
Protein, ur: NEGATIVE
Specific Gravity, Urine: 1.027 (ref 1.001–1.03)
pH: 5.5 (ref 5.0–8.0)

## 2019-01-06 LAB — PROTEIN ELECTROPHORESIS, SERUM, WITH REFLEX
Albumin ELP: 4.2 g/dL (ref 3.8–4.8)
Alpha 1: 0.4 g/dL — ABNORMAL HIGH (ref 0.2–0.3)
Alpha 2: 0.9 g/dL (ref 0.5–0.9)
Beta 2: 0.3 g/dL (ref 0.2–0.5)
Beta Globulin: 0.6 g/dL (ref 0.4–0.6)
Gamma Globulin: 0.8 g/dL (ref 0.8–1.7)
Total Protein: 7.1 g/dL (ref 6.1–8.1)

## 2019-01-06 LAB — CBC WITH DIFFERENTIAL/PLATELET
Absolute Monocytes: 574 cells/uL (ref 200–950)
Basophils Absolute: 49 cells/uL (ref 0–200)
Basophils Relative: 0.6 %
Eosinophils Absolute: 57 cells/uL (ref 15–500)
Eosinophils Relative: 0.7 %
HCT: 40.1 % (ref 35.0–45.0)
Hemoglobin: 13.5 g/dL (ref 11.7–15.5)
Lymphs Abs: 1648 cells/uL (ref 850–3900)
MCH: 29.9 pg (ref 27.0–33.0)
MCHC: 33.7 g/dL (ref 32.0–36.0)
MCV: 88.9 fL (ref 80.0–100.0)
MPV: 10.7 fL (ref 7.5–12.5)
Monocytes Relative: 7 %
Neutro Abs: 5871 cells/uL (ref 1500–7800)
Neutrophils Relative %: 71.6 %
Platelets: 298 10*3/uL (ref 140–400)
RBC: 4.51 10*6/uL (ref 3.80–5.10)
RDW: 11.8 % (ref 11.0–15.0)
Total Lymphocyte: 20.1 %
WBC: 8.2 10*3/uL (ref 3.8–10.8)

## 2019-01-06 LAB — COMPLETE METABOLIC PANEL WITH GFR
AG Ratio: 1.6 (calc) (ref 1.0–2.5)
ALT: 16 U/L (ref 6–29)
AST: 16 U/L (ref 10–30)
Albumin: 4.4 g/dL (ref 3.6–5.1)
Alkaline phosphatase (APISO): 69 U/L (ref 31–125)
BUN: 18 mg/dL (ref 7–25)
CO2: 21 mmol/L (ref 20–32)
Calcium: 9.8 mg/dL (ref 8.6–10.2)
Chloride: 102 mmol/L (ref 98–110)
Creat: 0.9 mg/dL (ref 0.50–1.10)
GFR, Est African American: 91 mL/min/{1.73_m2} (ref >=60)
GFR, Est Non African American: 78 mL/min/{1.73_m2} (ref >=60)
Globulin: 2.7 g/dL (calc) (ref 1.9–3.7)
Glucose, Bld: 76 mg/dL (ref 65–99)
Potassium: 4.5 mmol/L (ref 3.5–5.3)
Sodium: 139 mmol/L (ref 135–146)
Total Bilirubin: 0.3 mg/dL (ref 0.2–1.2)
Total Protein: 7.1 g/dL (ref 6.1–8.1)

## 2019-01-06 LAB — HEPATITIS B CORE ANTIBODY, IGM: Hep B C IgM: NONREACTIVE

## 2019-01-06 LAB — QUANTIFERON-TB GOLD PLUS
Mitogen-NIL: >10 IU/mL
NIL: 0.05 IU/mL
QuantiFERON-TB Gold Plus: NEGATIVE
TB1-NIL: 0 IU/mL
TB2-NIL: <0 IU/mL

## 2019-01-06 LAB — BETA-2 GLYCOPROTEIN ANTIBODIES
Beta-2 Glyco 1 IgA: <9 SAU (ref <=20)
Beta-2 Glyco 1 IgM: 9 SMU (ref <=20)
Beta-2 Glyco I IgG: <9 SGU (ref <=20)

## 2019-01-06 LAB — SJOGRENS SYNDROME-B EXTRACTABLE NUCLEAR ANTIBODY: SSB (La) (ENA) Antibody, IgG: <1 AI

## 2019-01-06 LAB — CARDIOLIPIN ANTIBODIES, IGG, IGM, IGA
Anticardiolipin IgA: <11 [APL'U]
Anticardiolipin IgG: <14 [GPL'U]
Anticardiolipin IgM: <12 [MPL'U]

## 2019-01-06 LAB — ANTI-SCLERODERMA ANTIBODY: Scleroderma (Scl-70) (ENA) Antibody, IgG: <1 AI

## 2019-01-06 LAB — SEDIMENTATION RATE: Sed Rate: 11 mm/h (ref 0–20)

## 2019-01-06 LAB — HEPATITIS B SURFACE ANTIGEN: Hepatitis B Surface Ag: NONREACTIVE

## 2019-01-06 LAB — LUPUS ANTICOAGULANT EVAL W/ REFLEX
PTT-LA Screen: 30 s (ref <=40)
dRVVT: 39 s (ref <=45)

## 2019-01-06 LAB — HEPATITIS C ANTIBODY
Hepatitis C Ab: NONREACTIVE
SIGNAL TO CUT-OFF: 0.01 (ref <1.00)

## 2019-01-06 LAB — CK: Total CK: 60 U/L (ref 29–143)

## 2019-01-06 LAB — CYCLIC CITRUL PEPTIDE ANTIBODY, IGG: Cyclic Citrullin Peptide Ab: <16 UNITS

## 2019-01-06 LAB — RHEUMATOID FACTOR: Rhuematoid fact SerPl-aCnc: <14 IU/mL (ref <14)

## 2019-01-06 LAB — C3 AND C4
C3 Complement: 162 mg/dL (ref 83–193)
C4 Complement: 36 mg/dL (ref 15–57)

## 2019-01-06 LAB — GLUCOSE 6 PHOSPHATE DEHYDROGENASE: G-6PDH: 15 U/g Hgb (ref 7.0–20.5)

## 2019-01-06 LAB — SJOGRENS SYNDROME-A EXTRACTABLE NUCLEAR ANTIBODY: SSA (Ro) (ENA) Antibody, IgG: <1 AI

## 2019-01-06 LAB — ANTI-DNA ANTIBODY, DOUBLE-STRANDED: ds DNA Ab: 8 IU/mL — ABNORMAL HIGH

## 2019-01-06 LAB — ANA: Anti Nuclear Antibody (ANA): NEGATIVE

## 2019-01-06 LAB — VITAMIN D 25 HYDROXY (VIT D DEFICIENCY, FRACTURES): Vit D, 25-Hydroxy: 49 ng/mL (ref 30–100)

## 2019-01-06 LAB — RNP ANTIBODY: Ribonucleic Protein(ENA) Antibody, IgG: <1 AI

## 2019-01-06 LAB — HIV ANTIBODY (ROUTINE TESTING W REFLEX): HIV 1&2 Ab, 4th Generation: NONREACTIVE

## 2019-01-07 ENCOUNTER — Ambulatory Visit (INDEPENDENT_AMBULATORY_CARE_PROVIDER_SITE_OTHER): Payer: BC Managed Care – PPO | Admitting: Rheumatology

## 2019-01-07 ENCOUNTER — Other Ambulatory Visit: Payer: Self-pay

## 2019-01-07 ENCOUNTER — Telehealth: Payer: Self-pay

## 2019-01-07 ENCOUNTER — Encounter: Payer: Self-pay | Admitting: Rheumatology

## 2019-01-07 VITALS — BP 122/75 | HR 90 | Resp 14 | Ht 63.0 in | Wt 177.0 lb

## 2019-01-07 DIAGNOSIS — F419 Anxiety disorder, unspecified: Secondary | ICD-10-CM

## 2019-01-07 DIAGNOSIS — Z79899 Other long term (current) drug therapy: Secondary | ICD-10-CM

## 2019-01-07 DIAGNOSIS — M2550 Pain in unspecified joint: Secondary | ICD-10-CM

## 2019-01-07 DIAGNOSIS — Z8639 Personal history of other endocrine, nutritional and metabolic disease: Secondary | ICD-10-CM

## 2019-01-07 DIAGNOSIS — I7300 Raynaud's syndrome without gangrene: Secondary | ICD-10-CM

## 2019-01-07 DIAGNOSIS — I10 Essential (primary) hypertension: Secondary | ICD-10-CM

## 2019-01-07 DIAGNOSIS — F32A Depression, unspecified: Secondary | ICD-10-CM

## 2019-01-07 DIAGNOSIS — F329 Major depressive disorder, single episode, unspecified: Secondary | ICD-10-CM

## 2019-01-07 DIAGNOSIS — R5383 Other fatigue: Secondary | ICD-10-CM

## 2019-01-07 DIAGNOSIS — M25572 Pain in left ankle and joints of left foot: Secondary | ICD-10-CM

## 2019-01-07 DIAGNOSIS — M84374A Stress fracture, right foot, initial encounter for fracture: Secondary | ICD-10-CM

## 2019-01-07 DIAGNOSIS — M359 Systemic involvement of connective tissue, unspecified: Secondary | ICD-10-CM | POA: Diagnosis not present

## 2019-01-07 MED ORDER — HYDROXYCHLOROQUINE SULFATE 200 MG PO TABS: 200.0000 mg | ORAL_TABLET | Freq: Two times a day (BID) | ORAL | 0 refills | Status: DC

## 2019-01-07 NOTE — Patient Instructions (Signed)
Standing Labs We placed an order today for your standing lab work.    Please come back and get your standing labs in 1 month, 3 months and then every 5 months.   We have open lab daily Monday through Thursday from 8:30-12:30 PM and 1:30-4:30 PM and Friday from 8:30-12:30 PM and 1:30-4:00 PM at the office of Dr. Shaili Deveshwar.   You may experience shorter wait times on Monday and Friday afternoons. The office is located at [address], [address], [place], [place] [address] No appointment is necessary.   Labs are drawn by [facility].  You may receive a bill from [facility] for your lab work.  If you wish to have your labs drawn at another location, please call the office 24 hours in advance to send orders.  If you have any questions regarding directions or hours of operation,  please call [phone].   Just as a reminder please drink plenty of water prior to coming for your lab work. Thanks!   Hydroxychloroquine tablets What is this medicine? HYDROXYCHLOROQUINE (hye drox ee KLOR oh kwin) is used to treat rheumatoid arthritis and systemic lupus erythematosus. It is also used to treat malaria. This medicine may be used for other purposes; ask your health care provider or pharmacist if you have questions. COMMON BRAND NAME(S): Plaquenil, Quineprox What should I tell my health care provider before I take this medicine? They need to know if you have any of these conditions:  diabetes  eye disease, vision problems  G6PD deficiency  heart disease  history of irregular heartbeat  if you often drink alcohol  kidney disease  liver disease  porphyria  psoriasis  an unusual or allergic reaction to chloroquine, hydroxychloroquine, other medicines, foods, dyes, or preservatives  pregnant or trying to get pregnant  breast-feeding How should I use this medicine? Take this medicine by mouth with a glass of water. Follow the directions on the prescription label. Do not cut, crush  or chew this medicine. Swallow the tablets whole. Take this medicine with food. Avoid taking antacids within 4 hours of taking this medicine. It is best to separate these medicines by at least 4 hours. Take your medicine at regular intervals. Do not take it more often than directed. Take all of your medicine as directed even if you think you are better. Do not skip doses or stop your medicine early. Talk to your pediatrician regarding the use of this medicine in children. While this drug may be prescribed for selected conditions, precautions do apply. Overdosage: If you think you have taken too much of this medicine contact a poison control center or emergency room at once. NOTE: This medicine is only for you. Do not share this medicine with others. What if I miss a dose? If you miss a dose, take it as soon as you can. If it is almost time for your next dose, take only that dose. Do not take double or extra doses. What may interact with this medicine? Do not take this medicine with any of the following medications:  cisapride  dronedarone  pimozide  thioridazine This medicine may also interact with the following medications:  ampicillin  antacids  cimetidine  cyclosporine  digoxin  kaolin  medicines for diabetes, like insulin, glipizide, glyburide  medicines for seizures like carbamazepine, phenobarbital, phenytoin  mefloquine  methotrexate  other medicines that prolong the QT interval (cause an abnormal heart rhythm)  praziquantel This list may not describe all possible interactions. Give your health care   provider a list of all the medicines, herbs, non-prescription drugs, or dietary supplements you use. Also tell them if you smoke, drink alcohol, or use illegal drugs. Some items may interact with your medicine. What should I watch for while using this medicine? Visit your health care professional for regular checks on your progress. Tell your health care professional if  your symptoms do not start to get better or if they get worse. You may need blood work done while you are taking this medicine. If you take other medicines that can affect heart rhythm, you may need more testing. Talk to your health care professional if you have questions. Your vision may be tested before and during use of this medicine. Tell your health care professional right away if you have any change in your eyesight. What side effects may I notice from receiving this medicine? Side effects that you should report to your doctor or health care professional as soon as possible:  allergic reactions like skin rash, itching or hives, swelling of the face, lips, or tongue  changes in vision  decreased hearing or ringing of the ears  muscle weakness  redness, blistering, peeling or loosening of the skin, including inside the mouth  sensitivity to light  signs and symptoms of a dangerous change in heartbeat or heart rhythm like chest pain; dizziness; fast or irregular heartbeat; palpitations; feeling faint or lightheaded, falls; breathing problems  signs and symptoms of liver injury like dark yellow or brown urine; general ill feeling or flu-like symptoms; light-colored stools; loss of appetite; nausea; right upper belly pain; unusually weak or tired; yellowing of the eyes or skin  signs and symptoms of low blood sugar such as feeling anxious; confusion; dizziness; increased hunger; unusually weak or tired; sweating; shakiness; cold; irritable; headache; blurred vision; fast heartbeat; loss of consciousness  suicidal thoughts  uncontrollable head, mouth, neck, arm, or leg movements Side effects that usually do not require medical attention (report to your doctor or health care professional if they continue or are bothersome):  diarrhea  dizziness  hair loss  headache  irritable  loss of appetite  nausea, vomiting  stomach pain This list may not describe all possible side  effects. Call your doctor for medical advice about side effects. You may report side effects to FDA at 1-800-FDA-1088. Where should I keep my medicine? Keep out of the reach of children. Store at room temperature between 15 and 30 degrees C (59 and 86 degrees F). Protect from moisture and light. Throw away any unused medicine after the expiration date. NOTE: This sheet is a summary. It may not cover all possible information. If you have questions about this medicine, talk to your doctor, pharmacist, or health care provider.  2020 Elsevier/Gold Standard (2018-06-28 12:56:32)  

## 2019-01-07 NOTE — Telephone Encounter (Signed)
Fax received from [facility] requiring a PA for plaquenil prescription. PA was completed on 01/07/2019 via covermymeds. Awaiting response.

## 2019-01-10 NOTE — Telephone Encounter (Signed)
Received notification from [facility] regarding a prior authorization for Plaquenil. Authorization has been APPROVED from 01/07/2019  to 01/05/2022.   Will send document to scan center.

## 2019-01-21 ENCOUNTER — Ambulatory Visit: Payer: BLUE CROSS/BLUE SHIELD | Admitting: Rheumatology

## 2019-02-01 ENCOUNTER — Other Ambulatory Visit: Payer: Self-pay | Admitting: Family Medicine

## 2019-02-18 DIAGNOSIS — Z6831 Body mass index (BMI) 31.0-31.9, adult: Secondary | ICD-10-CM | POA: Diagnosis not present

## 2019-02-18 DIAGNOSIS — E6609 Other obesity due to excess calories: Secondary | ICD-10-CM | POA: Diagnosis not present

## 2019-02-18 DIAGNOSIS — Z713 Dietary counseling and surveillance: Secondary | ICD-10-CM | POA: Diagnosis not present

## 2019-04-11 ENCOUNTER — Ambulatory Visit: Payer: BC Managed Care – PPO | Admitting: Rheumatology

## 2019-04-27 ENCOUNTER — Other Ambulatory Visit: Payer: Self-pay | Admitting: Family Medicine

## 2019-04-27 DIAGNOSIS — I10 Essential (primary) hypertension: Secondary | ICD-10-CM

## 2019-05-17 ENCOUNTER — Encounter: Payer: Self-pay | Admitting: Family Medicine

## 2019-05-17 ENCOUNTER — Ambulatory Visit: Payer: BC Managed Care – PPO | Admitting: Family Medicine

## 2019-05-17 ENCOUNTER — Other Ambulatory Visit: Payer: Self-pay

## 2019-05-17 VITALS — BP 110/64 | HR 90 | Temp 97.2°F | Resp 14 | Ht 63.0 in | Wt 169.0 lb

## 2019-05-17 DIAGNOSIS — E7800 Pure hypercholesterolemia, unspecified: Secondary | ICD-10-CM

## 2019-05-17 DIAGNOSIS — I10 Essential (primary) hypertension: Secondary | ICD-10-CM

## 2019-05-17 NOTE — Progress Notes (Signed)
Subjective:    Patient ID: Veronica Armstrong, female    DOB: [birth year], 44 y.o.   MRN: [id number]  HPI Patient is a 44 year old with history of hypertension. with history of hypertension.  Currently taking amlodipine 5 mg a day with metoprolol 25 mg a day.  Denies any chest pain shortness of breath or dyspnea on exertion.  Does have a family history of heart disease.  Father had coronary artery bypass grafting.  Mother has a history of hyperlipidemia.   Past Medical History:  Diagnosis Date  . Benign essential HTN   . HLD (hyperlipidemia)    Past Surgical History:  Procedure Laterality Date  . CESAREAN SECTION    . LAPAROSCOPY    . NECK SURGERY     Current Outpatient Medications on File Prior to Visit  Medication Sig Dispense Refill  . amLODipine (NORVASC) 5 MG tablet TAKE 1 TABLET BY MOUTH EVERY DAY 90 tablet 2  . buPROPion (WELLBUTRIN SR) 150 MG 12 hr tablet Take 1 tablet by mouth 2 (two) times daily.  2  . fluticasone (FLONASE) 50 MCG/ACT nasal spray SPRAY 2 SPRAYS INTO EACH NOSTRIL EVERY DAY 48 g 0  . hydroxychloroquine (PLAQUENIL) 200 MG tablet Take 1 tablet (200 mg total) by mouth 2 (two) times daily. 60 tablet 0  . metoprolol succinate (TOPROL-XL) 25 MG 24 hr tablet TAKE 1 TABLET BY MOUTH EVERY DAY 30 tablet 0  . norethindrone-ethinyl estradiol (MICROGESTIN,JUNEL,LOESTRIN) 1-20 MG-MCG tablet TAKE 1 TABLET BY MOUTH DAILY. ACTIVE TABLETS ONLY  4  . NOVOFINE PLUS 32G X 4 MM MISC USE 1 D FOR INJECTION WITH SAXENDA    . SAXENDA 18 MG/3ML SOPN      No current facility-administered medications on file prior to visit.   Allergies  Allergen Reactions  . Promethazine Other (See Comments)    Seizure  . Augmentin [Amoxicillin-Pot Clavulanate]     Diarrhea  . Cefdinir Itching  . Erythromycin Hives  . Hydrocodone Itching  . Other     Pineapple , hives   Social History   Socioeconomic History  . Marital status: Married    Spouse name: Not on file  . Number of children: Not on file  . Years of education:  Not on file  . Highest education level: Not on file  Occupational History  . Not on file  Tobacco Use  . Smoking status: Never Smoker  . Smokeless tobacco: Never Used  Substance and Sexual Activity  . Alcohol use: Yes    Alcohol/week: 0.0 standard drinks    Comment: Social  . Drug use: No  . Sexual activity: Not on file  Other Topics Concern  . Not on file  Social History Narrative  . Not on file   Social Determinants of Health   Financial Resource Strain:   . Difficulty of Paying Living Expenses:   Food Insecurity:   . Worried About Programme researcher, broadcasting/film/video in the Last Year:   . Barista in the Last Year:   Transportation Needs:   . Freight forwarder (Medical):   Marland Kitchen Lack of Transportation (Non-Medical):   Physical Activity:   . Days of Exercise per Week:   . Minutes of Exercise per Session:   Stress:   . Feeling of Stress :   Social Connections:   . Frequency of Communication with Friends and Family:   . Frequency of Social Gatherings with Friends and Family:   . Attends Religious Services:   . Active Member of Clubs or  Organizations:   . Attends Archivist Meetings:   Marland Kitchen Marital Status:   Intimate Partner Violence:   . Fear of Current or Ex-Partner:   . Emotionally Abused:   Marland Kitchen Physically Abused:   . Sexually Abused:       Review of Systems  All other systems reviewed and are negative.      Objective:   Physical Exam  Constitutional: She appears well-developed and well-nourished. No distress.  Neck: No JVD present. No thyromegaly present.  Cardiovascular: Normal rate, regular rhythm and normal heart sounds. Exam reveals no gallop and no friction rub.  No murmur heard. Pulmonary/Chest: Effort normal and breath sounds normal. No respiratory distress. She has no wheezes. She has no rales.  Abdominal: Soft. Bowel sounds are normal. She exhibits no distension. There is no abdominal tenderness. There is no rebound.  Musculoskeletal:         General: No tenderness or edema.     Cervical back: Neck supple.  Lymphadenopathy:    She has no cervical adenopathy.  Skin: She is not diaphoretic.  Vitals reviewed.         Assessment & Plan:  Benign essential HTN - Plan: CBC with Differential/Platelet, COMPLETE METABOLIC PANEL WITH GFR, Lipid panel  Pure hypercholesterolemia - Plan: CBC with Differential/Platelet, COMPLETE METABOLIC PANEL WITH GFR, Lipid panel  Check cbc, cmp, flp.  Goal ldl cholesterol is less than 130.  Would recommend statin if LDL is >130 given family history.  Blood pressure today is excellent. Recommended COVID-19 vaccine.

## 2019-05-18 DIAGNOSIS — M542 Cervicalgia: Secondary | ICD-10-CM | POA: Diagnosis not present

## 2019-05-18 DIAGNOSIS — M7521 Bicipital tendinitis, right shoulder: Secondary | ICD-10-CM | POA: Diagnosis not present

## 2019-05-18 LAB — COMPLETE METABOLIC PANEL WITH GFR
AG Ratio: 1.6 (calc) (ref 1.0–2.5)
ALT: 15 U/L (ref 6–29)
AST: 16 U/L (ref 10–30)
Albumin: 4.3 g/dL (ref 3.6–5.1)
Alkaline phosphatase (APISO): 80 U/L (ref 31–125)
BUN: 15 mg/dL (ref 7–25)
CO2: 21 mmol/L (ref 20–32)
Calcium: 9.8 mg/dL (ref 8.6–10.2)
Chloride: 106 mmol/L (ref 98–110)
Creat: 0.87 mg/dL (ref 0.50–1.10)
GFR, Est African American: 94 mL/min/{1.73_m2} (ref >=60)
GFR, Est Non African American: 81 mL/min/{1.73_m2} (ref >=60)
Globulin: 2.7 g/dL (calc) (ref 1.9–3.7)
Glucose, Bld: 92 mg/dL (ref 65–99)
Potassium: 4.8 mmol/L (ref 3.5–5.3)
Sodium: 140 mmol/L (ref 135–146)
Total Bilirubin: 0.4 mg/dL (ref 0.2–1.2)
Total Protein: 7 g/dL (ref 6.1–8.1)

## 2019-05-18 LAB — LIPID PANEL
Cholesterol: 211 mg/dL — ABNORMAL HIGH (ref <200)
HDL: 67 mg/dL (ref >=50)
LDL Cholesterol (Calc): 116 mg/dL (calc) — ABNORMAL HIGH
Non-HDL Cholesterol (Calc): 144 mg/dL (calc) — ABNORMAL HIGH (ref <130)
Total CHOL/HDL Ratio: 3.1 (calc) (ref <5.0)
Triglycerides: 163 mg/dL — ABNORMAL HIGH (ref <150)

## 2019-05-18 LAB — CBC WITH DIFFERENTIAL/PLATELET
Absolute Monocytes: 676 cells/uL (ref 200–950)
Basophils Absolute: 52 cells/uL (ref 0–200)
Basophils Relative: 0.5 %
Eosinophils Absolute: 135 cells/uL (ref 15–500)
Eosinophils Relative: 1.3 %
HCT: 44.5 % (ref 35.0–45.0)
Hemoglobin: 15 g/dL (ref 11.7–15.5)
Lymphs Abs: 2070 cells/uL (ref 850–3900)
MCH: 30.2 pg (ref 27.0–33.0)
MCHC: 33.7 g/dL (ref 32.0–36.0)
MCV: 89.5 fL (ref 80.0–100.0)
MPV: 9.9 fL (ref 7.5–12.5)
Monocytes Relative: 6.5 %
Neutro Abs: 7467 cells/uL (ref 1500–7800)
Neutrophils Relative %: 71.8 %
Platelets: 352 10*3/uL (ref 140–400)
RBC: 4.97 10*6/uL (ref 3.80–5.10)
RDW: 12 % (ref 11.0–15.0)
Total Lymphocyte: 19.9 %
WBC: 10.4 10*3/uL (ref 3.8–10.8)

## 2019-05-22 ENCOUNTER — Other Ambulatory Visit: Payer: Self-pay | Admitting: Family Medicine

## 2019-05-22 DIAGNOSIS — I10 Essential (primary) hypertension: Secondary | ICD-10-CM

## 2019-07-05 DIAGNOSIS — Z713 Dietary counseling and surveillance: Secondary | ICD-10-CM | POA: Diagnosis not present

## 2019-07-05 DIAGNOSIS — Z6831 Body mass index (BMI) 31.0-31.9, adult: Secondary | ICD-10-CM | POA: Diagnosis not present

## 2019-08-30 ENCOUNTER — Ambulatory Visit: Payer: BC Managed Care – PPO

## 2019-09-09 ENCOUNTER — Other Ambulatory Visit: Payer: Self-pay | Admitting: Family Medicine

## 2019-09-09 MED ORDER — FLUTICASONE PROPIONATE 50 MCG/ACT NA SUSP: 2.0000 | Freq: Every day | NASAL | 11 refills | Status: DC

## 2019-09-14 DIAGNOSIS — Z20828 Contact with and (suspected) exposure to other viral communicable diseases: Secondary | ICD-10-CM | POA: Diagnosis not present

## 2019-10-11 ENCOUNTER — Encounter: Payer: Self-pay | Admitting: Family Medicine

## 2019-10-14 DIAGNOSIS — Z713 Dietary counseling and surveillance: Secondary | ICD-10-CM | POA: Diagnosis not present

## 2019-10-14 DIAGNOSIS — Z6829 Body mass index (BMI) 29.0-29.9, adult: Secondary | ICD-10-CM | POA: Diagnosis not present

## 2019-10-20 ENCOUNTER — Encounter: Payer: Self-pay | Admitting: Family Medicine

## 2019-11-02 ENCOUNTER — Other Ambulatory Visit: Payer: Self-pay

## 2019-11-02 MED ORDER — AMLODIPINE BESYLATE 5 MG PO TABS: 5.0000 mg | ORAL_TABLET | Freq: Every day | ORAL | 2 refills | Status: DC

## 2020-01-09 DIAGNOSIS — N809 Endometriosis, unspecified: Secondary | ICD-10-CM | POA: Diagnosis not present

## 2020-01-09 DIAGNOSIS — Z01419 Encounter for gynecological examination (general) (routine) without abnormal findings: Secondary | ICD-10-CM | POA: Diagnosis not present

## 2020-01-09 DIAGNOSIS — Z1231 Encounter for screening mammogram for malignant neoplasm of breast: Secondary | ICD-10-CM | POA: Diagnosis not present

## 2020-01-09 DIAGNOSIS — E669 Obesity, unspecified: Secondary | ICD-10-CM | POA: Diagnosis not present

## 2020-01-09 DIAGNOSIS — Z6830 Body mass index (BMI) 30.0-30.9, adult: Secondary | ICD-10-CM | POA: Diagnosis not present

## 2020-01-09 DIAGNOSIS — F419 Anxiety disorder, unspecified: Secondary | ICD-10-CM | POA: Diagnosis not present

## 2020-04-11 DIAGNOSIS — M542 Cervicalgia: Secondary | ICD-10-CM | POA: Diagnosis not present

## 2020-04-11 DIAGNOSIS — M25511 Pain in right shoulder: Secondary | ICD-10-CM | POA: Diagnosis not present

## 2020-04-18 DIAGNOSIS — M5030 Other cervical disc degeneration, unspecified cervical region: Secondary | ICD-10-CM | POA: Diagnosis not present

## 2020-04-26 DIAGNOSIS — M542 Cervicalgia: Secondary | ICD-10-CM | POA: Diagnosis not present

## 2020-05-02 ENCOUNTER — Other Ambulatory Visit: Payer: Self-pay | Admitting: Family Medicine

## 2020-05-02 DIAGNOSIS — I10 Essential (primary) hypertension: Secondary | ICD-10-CM

## 2020-05-09 DIAGNOSIS — M25511 Pain in right shoulder: Secondary | ICD-10-CM | POA: Diagnosis not present

## 2020-05-15 DIAGNOSIS — M25511 Pain in right shoulder: Secondary | ICD-10-CM | POA: Diagnosis not present

## 2020-05-22 DIAGNOSIS — G5621 Lesion of ulnar nerve, right upper limb: Secondary | ICD-10-CM | POA: Diagnosis not present

## 2020-05-28 DIAGNOSIS — I7300 Raynaud's syndrome without gangrene: Secondary | ICD-10-CM | POA: Insufficient documentation

## 2020-05-28 DIAGNOSIS — F419 Anxiety disorder, unspecified: Secondary | ICD-10-CM | POA: Insufficient documentation

## 2020-05-28 DIAGNOSIS — K219 Gastro-esophageal reflux disease without esophagitis: Secondary | ICD-10-CM | POA: Insufficient documentation

## 2020-05-28 DIAGNOSIS — F32A Depression, unspecified: Secondary | ICD-10-CM | POA: Insufficient documentation

## 2020-05-28 DIAGNOSIS — N979 Female infertility, unspecified: Secondary | ICD-10-CM | POA: Insufficient documentation

## 2020-05-28 DIAGNOSIS — N920 Excessive and frequent menstruation with regular cycle: Secondary | ICD-10-CM | POA: Insufficient documentation

## 2020-05-28 DIAGNOSIS — N809 Endometriosis, unspecified: Secondary | ICD-10-CM | POA: Insufficient documentation

## 2020-05-28 DIAGNOSIS — N8300 Follicular cyst of ovary, unspecified side: Secondary | ICD-10-CM | POA: Insufficient documentation

## 2020-05-28 DIAGNOSIS — N840 Polyp of corpus uteri: Secondary | ICD-10-CM | POA: Insufficient documentation

## 2020-05-28 DIAGNOSIS — E538 Deficiency of other specified B group vitamins: Secondary | ICD-10-CM | POA: Insufficient documentation

## 2020-06-05 DIAGNOSIS — Z6830 Body mass index (BMI) 30.0-30.9, adult: Secondary | ICD-10-CM | POA: Diagnosis not present

## 2020-06-05 DIAGNOSIS — Z713 Dietary counseling and surveillance: Secondary | ICD-10-CM | POA: Diagnosis not present

## 2020-06-05 DIAGNOSIS — E669 Obesity, unspecified: Secondary | ICD-10-CM | POA: Diagnosis not present

## 2020-06-19 DIAGNOSIS — G5621 Lesion of ulnar nerve, right upper limb: Secondary | ICD-10-CM | POA: Diagnosis not present

## 2020-07-13 ENCOUNTER — Other Ambulatory Visit: Payer: Self-pay | Admitting: *Deleted

## 2020-07-13 DIAGNOSIS — I10 Essential (primary) hypertension: Secondary | ICD-10-CM

## 2020-07-13 MED ORDER — METOPROLOL SUCCINATE ER 25 MG PO TB24: 1.0000 | ORAL_TABLET | Freq: Every day | ORAL | 0 refills | Status: DC

## 2020-07-13 MED ORDER — AMLODIPINE BESYLATE 5 MG PO TABS: 5.0000 mg | ORAL_TABLET | Freq: Every day | ORAL | 0 refills | Status: DC

## 2020-07-30 ENCOUNTER — Other Ambulatory Visit: Payer: Self-pay | Admitting: Family Medicine

## 2020-08-23 ENCOUNTER — Encounter: Payer: BC Managed Care – PPO | Admitting: Family Medicine

## 2020-09-07 ENCOUNTER — Ambulatory Visit: Payer: BC Managed Care – PPO | Admitting: Family Medicine

## 2020-09-07 ENCOUNTER — Other Ambulatory Visit: Payer: Self-pay

## 2020-09-07 ENCOUNTER — Encounter: Payer: Self-pay | Admitting: Family Medicine

## 2020-09-07 VITALS — BP 124/78 | HR 86 | Temp 97.9°F | Resp 14 | Ht 63.0 in | Wt 174.0 lb

## 2020-09-07 DIAGNOSIS — E7800 Pure hypercholesterolemia, unspecified: Secondary | ICD-10-CM | POA: Diagnosis not present

## 2020-09-07 DIAGNOSIS — I10 Essential (primary) hypertension: Secondary | ICD-10-CM | POA: Diagnosis not present

## 2020-09-07 LAB — CBC WITH DIFFERENTIAL/PLATELET
Absolute Monocytes: 774 cells/uL (ref 200–950)
Basophils Absolute: 61 cells/uL (ref 0–200)
Basophils Relative: 0.7 %
Eosinophils Absolute: 139 cells/uL (ref 15–500)
Eosinophils Relative: 1.6 %
HCT: 42.6 % (ref 35.0–45.0)
Hemoglobin: 14.1 g/dL (ref 11.7–15.5)
Lymphs Abs: 1757 cells/uL (ref 850–3900)
MCH: 29.9 pg (ref 27.0–33.0)
MCHC: 33.1 g/dL (ref 32.0–36.0)
MCV: 90.3 fL (ref 80.0–100.0)
MPV: 10.1 fL (ref 7.5–12.5)
Monocytes Relative: 8.9 %
Neutro Abs: 5968 cells/uL (ref 1500–7800)
Neutrophils Relative %: 68.6 %
Platelets: 358 10*3/uL (ref 140–400)
RBC: 4.72 10*6/uL (ref 3.80–5.10)
RDW: 11.5 % (ref 11.0–15.0)
Total Lymphocyte: 20.2 %
WBC: 8.7 10*3/uL (ref 3.8–10.8)

## 2020-09-07 LAB — COMPLETE METABOLIC PANEL WITH GFR
AG Ratio: 1.6 (calc) (ref 1.0–2.5)
ALT: 12 U/L (ref 6–29)
AST: 15 U/L (ref 10–35)
Albumin: 4.2 g/dL (ref 3.6–5.1)
Alkaline phosphatase (APISO): 77 U/L (ref 31–125)
BUN: 18 mg/dL (ref 7–25)
CO2: 25 mmol/L (ref 20–32)
Calcium: 9.8 mg/dL (ref 8.6–10.2)
Chloride: 104 mmol/L (ref 98–110)
Creat: 1 mg/dL (ref 0.50–1.10)
GFR, Est African American: 79 mL/min/{1.73_m2} (ref >=60)
GFR, Est Non African American: 68 mL/min/{1.73_m2} (ref >=60)
Globulin: 2.6 g/dL (calc) (ref 1.9–3.7)
Glucose, Bld: 79 mg/dL (ref 65–99)
Potassium: 5.1 mmol/L (ref 3.5–5.3)
Sodium: 139 mmol/L (ref 135–146)
Total Bilirubin: 0.4 mg/dL (ref 0.2–1.2)
Total Protein: 6.8 g/dL (ref 6.1–8.1)

## 2020-09-07 LAB — LIPID PANEL
Cholesterol: 227 mg/dL — ABNORMAL HIGH (ref <200)
HDL: 77 mg/dL (ref >=50)
LDL Cholesterol (Calc): 125 mg/dL (calc) — ABNORMAL HIGH
Non-HDL Cholesterol (Calc): 150 mg/dL (calc) — ABNORMAL HIGH (ref <130)
Total CHOL/HDL Ratio: 2.9 (calc) (ref <5.0)
Triglycerides: 135 mg/dL (ref <150)

## 2020-09-07 MED ORDER — METOPROLOL SUCCINATE ER 25 MG PO TB24: 25.0000 mg | ORAL_TABLET | Freq: Every day | ORAL | 3 refills | Status: DC

## 2020-09-07 MED ORDER — AMLODIPINE BESYLATE 5 MG PO TABS: 5.0000 mg | ORAL_TABLET | Freq: Every day | ORAL | 3 refills | Status: DC

## 2020-09-07 NOTE — Progress Notes (Signed)
Subjective:    Patient ID: Veronica Armstrong, female    DOB: 1975/07/22, 45 y.o.   MRN: [id number]  HPI Patient is a 45 year old with history of hypertension. with history of hypertension.  Currently taking amlodipine 5 mg a day with metoprolol 25 mg a day.  Denies any chest pain shortness of breath or dyspnea on exertion.  Does have a family history of heart disease.  Father had coronary artery bypass grafting.  Mother has a history of hyperlipidemia.  Overall, she is doing great.  She denies any palpitations or syncope or near syncope.  She does occasionally have PVCs but the metoprolol does a good job of controlling this.  She denies any swelling in her legs on the amlodipine. Past Medical History:  Diagnosis Date   Benign essential HTN    HLD (hyperlipidemia)    Past Surgical History:  Procedure Laterality Date   CESAREAN SECTION     LAPAROSCOPY     NECK SURGERY     Current Outpatient Medications on File Prior to Visit  Medication Sig Dispense Refill   amLODipine (NORVASC) 5 MG tablet Take 1 tablet (5 mg total) by mouth daily. 90 tablet 0   buPROPion (WELLBUTRIN SR) 150 MG 12 hr tablet Take 1 tablet by mouth 2 (two) times daily.  2   fluticasone (FLONASE) 50 MCG/ACT nasal spray Place 2 sprays into both nostrils daily. 48 g 11   metoprolol succinate (TOPROL-XL) 25 MG 24 hr tablet Take 1 tablet (25 mg total) by mouth daily. 90 tablet 0   norethindrone-ethinyl estradiol (MICROGESTIN,JUNEL,LOESTRIN) 1-20 MG-MCG tablet TAKE 1 TABLET BY MOUTH DAILY. ACTIVE TABLETS ONLY  4   NOVOFINE PLUS 32G X 4 MM MISC USE 1 D FOR INJECTION WITH SAXENDA     SAXENDA 18 MG/3ML SOPN      No current facility-administered medications on file prior to visit.   Allergies  Allergen Reactions   Promethazine Other (See Comments)    Seizure   Augmentin [Amoxicillin-Pot Clavulanate]     Diarrhea   Cefdinir Itching   Erythromycin Hives   Hydrocodone Itching   Other     Pineapple , hives   Social History   Socioeconomic History    Marital status: Married    Spouse name: Not on file   Number of children: Not on file   Years of education: Not on file   Highest education level: Not on file  Occupational History   Not on file  Tobacco Use   Smoking status: Never   Smokeless tobacco: Never  Vaping Use   Vaping Use: Never used  Substance and Sexual Activity   Alcohol use: Yes    Alcohol/week: 0.0 standard drinks    Comment: Social   Drug use: No   Sexual activity: Not on file  Other Topics Concern   Not on file  Social History Narrative   Not on file   Social Determinants of Health   Financial Resource Strain: Not on file  Food Insecurity: Not on file  Transportation Needs: Not on file  Physical Activity: Not on file  Stress: Not on file  Social Connections: Not on file  Intimate Partner Violence: Not on file      Review of Systems     Objective:   Physical Exam Constitutional:      General: She is not in acute distress.    Appearance: She is not diaphoretic.  Neck:     Thyroid: No thyromegaly.     Vascular: No JVD.  Cardiovascular:     Heart sounds: No murmur heard.   No friction rub. No gallop.  Musculoskeletal:     Cervical back: Neck supple.          Assessment & Plan:  Benign essential HTN - Plan: CBC with Differential/Platelet, COMPLETE METABOLIC PANEL WITH GFR, Lipid panel  Pure hypercholesterolemia - Plan: CBC with Differential/Platelet, COMPLETE METABOLIC PANEL WITH GFR, Lipid panel  Essential hypertension - Plan: metoprolol succinate (TOPROL-XL) 25 MG 24 hr tablet Blood pressure today is outstanding.  The remainder of her physical exam is normal.  I would like to check a CBC CMP and a lipid panel.  Continue metoprolol as well as amlodipine as prescribed.  I would aggressively treat her cholesterol given her family history if her LDL cholesterol is above 130 I would recommend a statin.

## 2020-10-01 DIAGNOSIS — M7541 Impingement syndrome of right shoulder: Secondary | ICD-10-CM | POA: Diagnosis not present

## 2020-10-01 DIAGNOSIS — G8918 Other acute postprocedural pain: Secondary | ICD-10-CM | POA: Diagnosis not present

## 2020-10-01 DIAGNOSIS — M19011 Primary osteoarthritis, right shoulder: Secondary | ICD-10-CM | POA: Diagnosis not present

## 2020-10-01 DIAGNOSIS — S46011A Strain of muscle(s) and tendon(s) of the rotator cuff of right shoulder, initial encounter: Secondary | ICD-10-CM | POA: Diagnosis not present

## 2020-10-01 DIAGNOSIS — M75111 Incomplete rotator cuff tear or rupture of right shoulder, not specified as traumatic: Secondary | ICD-10-CM | POA: Diagnosis not present

## 2020-10-01 DIAGNOSIS — M7521 Bicipital tendinitis, right shoulder: Secondary | ICD-10-CM | POA: Diagnosis not present

## 2020-10-01 DIAGNOSIS — M24111 Other articular cartilage disorders, right shoulder: Secondary | ICD-10-CM | POA: Diagnosis not present

## 2020-10-09 DIAGNOSIS — M19011 Primary osteoarthritis, right shoulder: Secondary | ICD-10-CM | POA: Diagnosis not present

## 2020-10-11 DIAGNOSIS — M25511 Pain in right shoulder: Secondary | ICD-10-CM | POA: Diagnosis not present

## 2020-10-11 DIAGNOSIS — M6281 Muscle weakness (generalized): Secondary | ICD-10-CM | POA: Diagnosis not present

## 2020-10-11 DIAGNOSIS — M25611 Stiffness of right shoulder, not elsewhere classified: Secondary | ICD-10-CM | POA: Diagnosis not present

## 2020-10-16 DIAGNOSIS — M25511 Pain in right shoulder: Secondary | ICD-10-CM | POA: Diagnosis not present

## 2020-10-16 DIAGNOSIS — M25611 Stiffness of right shoulder, not elsewhere classified: Secondary | ICD-10-CM | POA: Diagnosis not present

## 2020-10-16 DIAGNOSIS — M6281 Muscle weakness (generalized): Secondary | ICD-10-CM | POA: Diagnosis not present

## 2020-10-17 ENCOUNTER — Other Ambulatory Visit: Payer: Self-pay | Admitting: *Deleted

## 2020-10-17 MED ORDER — FLUTICASONE PROPIONATE 50 MCG/ACT NA SUSP: 2.0000 | Freq: Every day | NASAL | 3 refills | Status: DC

## 2020-10-18 DIAGNOSIS — M25611 Stiffness of right shoulder, not elsewhere classified: Secondary | ICD-10-CM | POA: Diagnosis not present

## 2020-10-18 DIAGNOSIS — M6281 Muscle weakness (generalized): Secondary | ICD-10-CM | POA: Diagnosis not present

## 2020-10-18 DIAGNOSIS — M25511 Pain in right shoulder: Secondary | ICD-10-CM | POA: Diagnosis not present

## 2020-10-23 DIAGNOSIS — M25611 Stiffness of right shoulder, not elsewhere classified: Secondary | ICD-10-CM | POA: Diagnosis not present

## 2020-10-23 DIAGNOSIS — M6281 Muscle weakness (generalized): Secondary | ICD-10-CM | POA: Diagnosis not present

## 2020-10-23 DIAGNOSIS — M25511 Pain in right shoulder: Secondary | ICD-10-CM | POA: Diagnosis not present

## 2020-10-25 DIAGNOSIS — M6281 Muscle weakness (generalized): Secondary | ICD-10-CM | POA: Diagnosis not present

## 2020-10-25 DIAGNOSIS — M25611 Stiffness of right shoulder, not elsewhere classified: Secondary | ICD-10-CM | POA: Diagnosis not present

## 2020-10-25 DIAGNOSIS — M25511 Pain in right shoulder: Secondary | ICD-10-CM | POA: Diagnosis not present

## 2020-11-01 DIAGNOSIS — M25511 Pain in right shoulder: Secondary | ICD-10-CM | POA: Diagnosis not present

## 2020-11-01 DIAGNOSIS — M25611 Stiffness of right shoulder, not elsewhere classified: Secondary | ICD-10-CM | POA: Diagnosis not present

## 2020-11-01 DIAGNOSIS — M6281 Muscle weakness (generalized): Secondary | ICD-10-CM | POA: Diagnosis not present

## 2020-11-06 DIAGNOSIS — M6281 Muscle weakness (generalized): Secondary | ICD-10-CM | POA: Diagnosis not present

## 2020-11-06 DIAGNOSIS — M25511 Pain in right shoulder: Secondary | ICD-10-CM | POA: Diagnosis not present

## 2020-11-06 DIAGNOSIS — M25611 Stiffness of right shoulder, not elsewhere classified: Secondary | ICD-10-CM | POA: Diagnosis not present

## 2020-11-08 DIAGNOSIS — M6281 Muscle weakness (generalized): Secondary | ICD-10-CM | POA: Diagnosis not present

## 2020-11-08 DIAGNOSIS — M25511 Pain in right shoulder: Secondary | ICD-10-CM | POA: Diagnosis not present

## 2020-11-08 DIAGNOSIS — M25611 Stiffness of right shoulder, not elsewhere classified: Secondary | ICD-10-CM | POA: Diagnosis not present

## 2020-11-13 DIAGNOSIS — M25512 Pain in left shoulder: Secondary | ICD-10-CM | POA: Diagnosis not present

## 2020-11-13 DIAGNOSIS — M25511 Pain in right shoulder: Secondary | ICD-10-CM | POA: Diagnosis not present

## 2020-11-13 DIAGNOSIS — M6281 Muscle weakness (generalized): Secondary | ICD-10-CM | POA: Diagnosis not present

## 2020-11-13 DIAGNOSIS — M25611 Stiffness of right shoulder, not elsewhere classified: Secondary | ICD-10-CM | POA: Diagnosis not present

## 2020-11-15 DIAGNOSIS — M6281 Muscle weakness (generalized): Secondary | ICD-10-CM | POA: Diagnosis not present

## 2020-11-15 DIAGNOSIS — M25611 Stiffness of right shoulder, not elsewhere classified: Secondary | ICD-10-CM | POA: Diagnosis not present

## 2020-11-15 DIAGNOSIS — M25511 Pain in right shoulder: Secondary | ICD-10-CM | POA: Diagnosis not present

## 2020-11-22 DIAGNOSIS — M25611 Stiffness of right shoulder, not elsewhere classified: Secondary | ICD-10-CM | POA: Diagnosis not present

## 2020-11-22 DIAGNOSIS — M25511 Pain in right shoulder: Secondary | ICD-10-CM | POA: Diagnosis not present

## 2020-11-22 DIAGNOSIS — M6281 Muscle weakness (generalized): Secondary | ICD-10-CM | POA: Diagnosis not present

## 2020-11-29 DIAGNOSIS — M25511 Pain in right shoulder: Secondary | ICD-10-CM | POA: Diagnosis not present

## 2020-11-29 DIAGNOSIS — M25611 Stiffness of right shoulder, not elsewhere classified: Secondary | ICD-10-CM | POA: Diagnosis not present

## 2020-11-29 DIAGNOSIS — M6281 Muscle weakness (generalized): Secondary | ICD-10-CM | POA: Diagnosis not present

## 2020-12-06 DIAGNOSIS — M6281 Muscle weakness (generalized): Secondary | ICD-10-CM | POA: Diagnosis not present

## 2020-12-06 DIAGNOSIS — M25511 Pain in right shoulder: Secondary | ICD-10-CM | POA: Diagnosis not present

## 2020-12-06 DIAGNOSIS — M25611 Stiffness of right shoulder, not elsewhere classified: Secondary | ICD-10-CM | POA: Diagnosis not present

## 2020-12-11 DIAGNOSIS — M25511 Pain in right shoulder: Secondary | ICD-10-CM | POA: Diagnosis not present

## 2020-12-11 DIAGNOSIS — M25611 Stiffness of right shoulder, not elsewhere classified: Secondary | ICD-10-CM | POA: Diagnosis not present

## 2020-12-11 DIAGNOSIS — M6281 Muscle weakness (generalized): Secondary | ICD-10-CM | POA: Diagnosis not present

## 2020-12-20 DIAGNOSIS — M6281 Muscle weakness (generalized): Secondary | ICD-10-CM | POA: Diagnosis not present

## 2020-12-20 DIAGNOSIS — M25611 Stiffness of right shoulder, not elsewhere classified: Secondary | ICD-10-CM | POA: Diagnosis not present

## 2020-12-20 DIAGNOSIS — M25511 Pain in right shoulder: Secondary | ICD-10-CM | POA: Diagnosis not present

## 2020-12-25 DIAGNOSIS — M25511 Pain in right shoulder: Secondary | ICD-10-CM | POA: Diagnosis not present

## 2020-12-27 DIAGNOSIS — M25611 Stiffness of right shoulder, not elsewhere classified: Secondary | ICD-10-CM | POA: Diagnosis not present

## 2020-12-27 DIAGNOSIS — M6281 Muscle weakness (generalized): Secondary | ICD-10-CM | POA: Diagnosis not present

## 2020-12-27 DIAGNOSIS — M25511 Pain in right shoulder: Secondary | ICD-10-CM | POA: Diagnosis not present

## 2021-01-03 DIAGNOSIS — M25611 Stiffness of right shoulder, not elsewhere classified: Secondary | ICD-10-CM | POA: Diagnosis not present

## 2021-01-03 DIAGNOSIS — M6281 Muscle weakness (generalized): Secondary | ICD-10-CM | POA: Diagnosis not present

## 2021-01-03 DIAGNOSIS — M25511 Pain in right shoulder: Secondary | ICD-10-CM | POA: Diagnosis not present

## 2021-01-17 DIAGNOSIS — M25611 Stiffness of right shoulder, not elsewhere classified: Secondary | ICD-10-CM | POA: Diagnosis not present

## 2021-01-17 DIAGNOSIS — M6281 Muscle weakness (generalized): Secondary | ICD-10-CM | POA: Diagnosis not present

## 2021-01-17 DIAGNOSIS — M25511 Pain in right shoulder: Secondary | ICD-10-CM | POA: Diagnosis not present

## 2021-01-28 DIAGNOSIS — N809 Endometriosis, unspecified: Secondary | ICD-10-CM | POA: Diagnosis not present

## 2021-01-28 DIAGNOSIS — Z1231 Encounter for screening mammogram for malignant neoplasm of breast: Secondary | ICD-10-CM | POA: Diagnosis not present

## 2021-01-28 DIAGNOSIS — F418 Other specified anxiety disorders: Secondary | ICD-10-CM | POA: Diagnosis not present

## 2021-01-28 DIAGNOSIS — Z124 Encounter for screening for malignant neoplasm of cervix: Secondary | ICD-10-CM | POA: Diagnosis not present

## 2021-01-28 DIAGNOSIS — Z6832 Body mass index (BMI) 32.0-32.9, adult: Secondary | ICD-10-CM | POA: Diagnosis not present

## 2021-01-28 DIAGNOSIS — E669 Obesity, unspecified: Secondary | ICD-10-CM | POA: Diagnosis not present

## 2021-01-28 DIAGNOSIS — Z01419 Encounter for gynecological examination (general) (routine) without abnormal findings: Secondary | ICD-10-CM | POA: Diagnosis not present

## 2021-01-28 DIAGNOSIS — Z01411 Encounter for gynecological examination (general) (routine) with abnormal findings: Secondary | ICD-10-CM | POA: Diagnosis not present

## 2021-02-08 DIAGNOSIS — M542 Cervicalgia: Secondary | ICD-10-CM | POA: Diagnosis not present

## 2021-02-14 DIAGNOSIS — M5412 Radiculopathy, cervical region: Secondary | ICD-10-CM | POA: Diagnosis not present

## 2021-02-14 DIAGNOSIS — Z6832 Body mass index (BMI) 32.0-32.9, adult: Secondary | ICD-10-CM | POA: Diagnosis not present

## 2021-02-14 DIAGNOSIS — I10 Essential (primary) hypertension: Secondary | ICD-10-CM | POA: Diagnosis not present

## 2021-02-22 ENCOUNTER — Ambulatory Visit (HOSPITAL_COMMUNITY): Payer: BC Managed Care – PPO | Admitting: Anesthesiology

## 2021-02-22 ENCOUNTER — Encounter (HOSPITAL_COMMUNITY): Payer: Self-pay | Admitting: Neurological Surgery

## 2021-02-22 ENCOUNTER — Other Ambulatory Visit: Payer: Self-pay | Admitting: Neurological Surgery

## 2021-02-22 ENCOUNTER — Other Ambulatory Visit: Payer: Self-pay

## 2021-02-22 ENCOUNTER — Ambulatory Visit (HOSPITAL_COMMUNITY)
Admission: RE | Admit: 2021-02-22 | Discharge: 2021-02-22 | Disposition: A | Discharge status: Home / Self Care | Payer: BC Managed Care – PPO | Source: Ambulatory Visit | Attending: Neurological Surgery | Admitting: Neurological Surgery

## 2021-02-22 ENCOUNTER — Ambulatory Visit (HOSPITAL_COMMUNITY): Payer: BC Managed Care – PPO

## 2021-02-22 ENCOUNTER — Encounter (HOSPITAL_COMMUNITY)
Admission: RE | Disposition: A | Discharge status: Home / Self Care | Payer: Self-pay | Source: Ambulatory Visit | Attending: Neurological Surgery

## 2021-02-22 DIAGNOSIS — I10 Essential (primary) hypertension: Secondary | ICD-10-CM | POA: Diagnosis not present

## 2021-02-22 DIAGNOSIS — M47812 Spondylosis without myelopathy or radiculopathy, cervical region: Secondary | ICD-10-CM | POA: Diagnosis not present

## 2021-02-22 DIAGNOSIS — Z419 Encounter for procedure for purposes other than remedying health state, unspecified: Secondary | ICD-10-CM

## 2021-02-22 DIAGNOSIS — M4802 Spinal stenosis, cervical region: Secondary | ICD-10-CM | POA: Insufficient documentation

## 2021-02-22 DIAGNOSIS — Z20822 Contact with and (suspected) exposure to covid-19: Secondary | ICD-10-CM | POA: Diagnosis not present

## 2021-02-22 DIAGNOSIS — M50122 Cervical disc disorder at C5-C6 level with radiculopathy: Secondary | ICD-10-CM | POA: Diagnosis not present

## 2021-02-22 DIAGNOSIS — M4722 Other spondylosis with radiculopathy, cervical region: Secondary | ICD-10-CM | POA: Insufficient documentation

## 2021-02-22 DIAGNOSIS — Z981 Arthrodesis status: Secondary | ICD-10-CM | POA: Diagnosis not present

## 2021-02-22 DIAGNOSIS — M4322 Fusion of spine, cervical region: Secondary | ICD-10-CM | POA: Diagnosis not present

## 2021-02-22 HISTORY — PX: ANTERIOR CERVICAL DECOMP/DISCECTOMY FUSION: SHX1161

## 2021-02-22 LAB — POCT PREGNANCY, URINE: Preg Test, Ur: NEGATIVE

## 2021-02-22 LAB — BASIC METABOLIC PANEL
Anion gap: 8 (ref 5–15)
BUN: 10 mg/dL (ref 6–20)
CO2: 21 mmol/L — ABNORMAL LOW (ref 22–32)
Calcium: 9.4 mg/dL (ref 8.9–10.3)
Chloride: 106 mmol/L (ref 98–111)
Creatinine, Ser: 0.89 mg/dL (ref 0.44–1.00)
GFR, Estimated: >60 mL/min (ref >60)
Glucose, Bld: 92 mg/dL (ref 70–99)
Potassium: 3.7 mmol/L (ref 3.5–5.1)
Sodium: 135 mmol/L (ref 135–145)

## 2021-02-22 LAB — CBC
HCT: 43.7 % (ref 36.0–46.0)
Hemoglobin: 14.1 g/dL (ref 12.0–15.0)
MCH: 29.7 pg (ref 26.0–34.0)
MCHC: 32.3 g/dL (ref 30.0–36.0)
MCV: 92 fL (ref 80.0–100.0)
Platelets: 321 10*3/uL (ref 150–400)
RBC: 4.75 MIL/uL (ref 3.87–5.11)
RDW: 11.8 % (ref 11.5–15.5)
WBC: 8 10*3/uL (ref 4.0–10.5)
nRBC: 0 % (ref 0.0–0.2)

## 2021-02-22 LAB — SURGICAL PCR SCREEN
MRSA, PCR: NEGATIVE
Staphylococcus aureus: POSITIVE — AB

## 2021-02-22 LAB — SARS CORONAVIRUS 2 BY RT PCR (HOSPITAL ORDER, PERFORMED IN ~~LOC~~ HOSPITAL LAB): SARS Coronavirus 2: NEGATIVE

## 2021-02-22 SURGERY — ANTERIOR CERVICAL DECOMPRESSION/DISCECTOMY FUSION 1 LEVEL
Anesthesia: General | Site: Neck

## 2021-02-22 MED ORDER — ORAL CARE MOUTH RINSE: 15.0000 mL | Freq: Once | OROMUCOSAL | Status: DC

## 2021-02-22 MED ORDER — BUPIVACAINE HCL (PF) 0.25 % IJ SOLN
INTRAMUSCULAR | Status: AC
  Filled 2021-02-22: qty 30

## 2021-02-22 MED ORDER — AMISULPRIDE (ANTIEMETIC) 5 MG/2ML IV SOLN
10.0000 mg | Freq: Once | INTRAVENOUS | Status: AC
  Administered 2021-02-22: 15:00:00 10 mg via INTRAVENOUS

## 2021-02-22 MED ORDER — MIDAZOLAM HCL 2 MG/2ML IJ SOLN
INTRAMUSCULAR | Status: DC | PRN
  Administered 2021-02-22: 2 mg via INTRAVENOUS

## 2021-02-22 MED ORDER — METHOCARBAMOL 500 MG PO TABS: 500.0000 mg | ORAL_TABLET | Freq: Four times a day (QID) | ORAL | 1 refills | Status: AC | PRN

## 2021-02-22 MED ORDER — ONDANSETRON HCL 4 MG/2ML IJ SOLN
INTRAMUSCULAR | Status: AC
  Filled 2021-02-22: qty 2

## 2021-02-22 MED ORDER — ROCURONIUM BROMIDE 10 MG/ML (PF) SYRINGE
PREFILLED_SYRINGE | INTRAVENOUS | Status: DC | PRN
  Administered 2021-02-22: 50 mg via INTRAVENOUS
  Administered 2021-02-22: 20 mg via INTRAVENOUS

## 2021-02-22 MED ORDER — DEXAMETHASONE SODIUM PHOSPHATE 10 MG/ML IJ SOLN
INTRAMUSCULAR | Status: DC | PRN
  Administered 2021-02-22: 10 mg via INTRAVENOUS

## 2021-02-22 MED ORDER — DEXMEDETOMIDINE (PRECEDEX) IN NS 20 MCG/5ML (4 MCG/ML) IV SYRINGE
PREFILLED_SYRINGE | INTRAVENOUS | Status: AC
  Filled 2021-02-22: qty 5

## 2021-02-22 MED ORDER — ORAL CARE MOUTH RINSE: 15.0000 mL | Freq: Once | OROMUCOSAL | Status: AC

## 2021-02-22 MED ORDER — PHENYLEPHRINE 40 MCG/ML (10ML) SYRINGE FOR IV PUSH (FOR BLOOD PRESSURE SUPPORT)
PREFILLED_SYRINGE | INTRAVENOUS | Status: DC | PRN
  Administered 2021-02-22: 80 ug via INTRAVENOUS
  Administered 2021-02-22: 40 ug via INTRAVENOUS

## 2021-02-22 MED ORDER — FENTANYL CITRATE (PF) 100 MCG/2ML IJ SOLN: 25.0000 ug | INTRAMUSCULAR | Status: DC | PRN

## 2021-02-22 MED ORDER — FENTANYL CITRATE (PF) 250 MCG/5ML IJ SOLN
INTRAMUSCULAR | Status: DC | PRN
  Administered 2021-02-22: 50 ug via INTRAVENOUS
  Administered 2021-02-22: 100 ug via INTRAVENOUS
  Administered 2021-02-22: 50 ug via INTRAVENOUS

## 2021-02-22 MED ORDER — BUPIVACAINE HCL (PF) 0.25 % IJ SOLN
INTRAMUSCULAR | Status: DC | PRN
  Administered 2021-02-22: 4 mL

## 2021-02-22 MED ORDER — MIDAZOLAM HCL 2 MG/2ML IJ SOLN
INTRAMUSCULAR | Status: AC
  Filled 2021-02-22: qty 2

## 2021-02-22 MED ORDER — LACTATED RINGERS IV SOLN: INTRAVENOUS | Status: DC

## 2021-02-22 MED ORDER — PROPOFOL 10 MG/ML IV BOLUS
INTRAVENOUS | Status: DC | PRN
  Administered 2021-02-22: 150 mg via INTRAVENOUS
  Administered 2021-02-22: 50 mg via INTRAVENOUS

## 2021-02-22 MED ORDER — SUGAMMADEX SODIUM 200 MG/2ML IV SOLN
INTRAVENOUS | Status: DC | PRN
  Administered 2021-02-22: 400 mg via INTRAVENOUS

## 2021-02-22 MED ORDER — OXYCODONE HCL 5 MG PO TABS: 5.0000 mg | ORAL_TABLET | ORAL | 0 refills | Status: DC | PRN

## 2021-02-22 MED ORDER — LIDOCAINE 2% (20 MG/ML) 5 ML SYRINGE
INTRAMUSCULAR | Status: DC | PRN
  Administered 2021-02-22: 60 mg via INTRAVENOUS

## 2021-02-22 MED ORDER — ONDANSETRON HCL 4 MG/2ML IJ SOLN: 4.0000 mg | Freq: Four times a day (QID) | INTRAMUSCULAR | Status: DC | PRN

## 2021-02-22 MED ORDER — FENTANYL CITRATE (PF) 250 MCG/5ML IJ SOLN
INTRAMUSCULAR | Status: AC
  Filled 2021-02-22: qty 5

## 2021-02-22 MED ORDER — CHLORHEXIDINE GLUCONATE 0.12 % MT SOLN
15.0000 mL | Freq: Once | OROMUCOSAL | Status: AC
  Administered 2021-02-22: 13:00:00 15 mL via OROMUCOSAL
  Filled 2021-02-22: qty 15

## 2021-02-22 MED ORDER — THROMBIN 5000 UNITS EX SOLR: OROMUCOSAL | Status: DC | PRN

## 2021-02-22 MED ORDER — THROMBIN 5000 UNITS EX SOLR
CUTANEOUS | Status: AC
  Filled 2021-02-22: qty 5000

## 2021-02-22 MED ORDER — CHLORHEXIDINE GLUCONATE CLOTH 2 % EX PADS: 6.0000 | MEDICATED_PAD | Freq: Once | CUTANEOUS | Status: DC

## 2021-02-22 MED ORDER — ROCURONIUM BROMIDE 10 MG/ML (PF) SYRINGE
PREFILLED_SYRINGE | INTRAVENOUS | Status: AC
  Filled 2021-02-22: qty 10

## 2021-02-22 MED ORDER — PHENYLEPHRINE 40 MCG/ML (10ML) SYRINGE FOR IV PUSH (FOR BLOOD PRESSURE SUPPORT)
PREFILLED_SYRINGE | INTRAVENOUS | Status: AC
  Filled 2021-02-22: qty 10

## 2021-02-22 MED ORDER — VANCOMYCIN HCL IN DEXTROSE 1-5 GM/200ML-% IV SOLN
1000.0000 mg | INTRAVENOUS | Status: AC
  Administered 2021-02-22: 13:00:00 1000 mg via INTRAVENOUS
  Filled 2021-02-22: qty 200

## 2021-02-22 MED ORDER — AMISULPRIDE (ANTIEMETIC) 5 MG/2ML IV SOLN
INTRAVENOUS | Status: AC
  Filled 2021-02-22: qty 4

## 2021-02-22 MED ORDER — CHLORHEXIDINE GLUCONATE 0.12 % MT SOLN: 15.0000 mL | Freq: Once | OROMUCOSAL | Status: DC

## 2021-02-22 MED ORDER — PROPOFOL 10 MG/ML IV BOLUS
INTRAVENOUS | Status: AC
  Filled 2021-02-22: qty 20

## 2021-02-22 MED ORDER — ONDANSETRON HCL 4 MG/2ML IJ SOLN
INTRAMUSCULAR | Status: DC | PRN
  Administered 2021-02-22: 4 mg via INTRAVENOUS

## 2021-02-22 MED ORDER — 0.9 % SODIUM CHLORIDE (POUR BTL) OPTIME
TOPICAL | Status: DC | PRN
  Administered 2021-02-22: 14:00:00 1000 mL

## 2021-02-22 MED ORDER — OXYCODONE HCL 5 MG/5ML PO SOLN: 5.0000 mg | Freq: Once | ORAL | Status: DC | PRN

## 2021-02-22 MED ORDER — OXYCODONE HCL 5 MG PO TABS: 5.0000 mg | ORAL_TABLET | Freq: Once | ORAL | Status: DC | PRN

## 2021-02-22 MED ORDER — CHLORHEXIDINE GLUCONATE CLOTH 2 % EX PADS
6.0000 | MEDICATED_PAD | Freq: Once | CUTANEOUS | Status: AC
  Administered 2021-02-22: 13:00:00 6 via TOPICAL

## 2021-02-22 SURGICAL SUPPLY — 51 items
BAG COUNTER SPONGE SURGICOUNT (BAG) ×2 IMPLANT
BAG SURGICOUNT SPONGE COUNTING (BAG) ×1
BAND RUBBER #18 3X1/16 STRL (MISCELLANEOUS) ×6 IMPLANT
BASKET BONE COLLECTION (BASKET) ×2 IMPLANT
BENZOIN TINCTURE PRP APPL 2/3 (GAUZE/BANDAGES/DRESSINGS) ×3 IMPLANT
BIT DRILL ACP 13 (DRILL) IMPLANT
BONE MATRIX OSTEOCEL PRO SM (Bone Implant) ×2 IMPLANT
BUR CARBIDE MATCH 3.0 (BURR) ×3 IMPLANT
CAGE SMALL 7X13X15 (Cage) ×2 IMPLANT
CANISTER SUCT 3000ML PPV (MISCELLANEOUS) ×3 IMPLANT
CLOSURE WOUND 1/2 X4 (GAUZE/BANDAGES/DRESSINGS) ×1
DRAPE C-ARM 42X72 X-RAY (DRAPES) ×6 IMPLANT
DRAPE LAPAROTOMY 100X72 PEDS (DRAPES) ×3 IMPLANT
DRAPE MICROSCOPE LEICA (MISCELLANEOUS) ×3 IMPLANT
DRILL ACP 13 (DRILL) ×3
DRSG OPSITE POSTOP 4X6 (GAUZE/BANDAGES/DRESSINGS) ×2 IMPLANT
DURAPREP 6ML APPLICATOR 50/CS (WOUND CARE) ×3 IMPLANT
ELECT COATED BLADE 2.86 ST (ELECTRODE) ×3 IMPLANT
ELECT REM PT RETURN 9FT ADLT (ELECTROSURGICAL) ×3
ELECTRODE REM PT RTRN 9FT ADLT (ELECTROSURGICAL) ×1 IMPLANT
GAUZE 4X4 16PLY ~~LOC~~+RFID DBL (SPONGE) ×2 IMPLANT
GLOVE SURG ENC MOIS LTX SZ7 (GLOVE) ×2 IMPLANT
GLOVE SURG ENC MOIS LTX SZ8 (GLOVE) ×3 IMPLANT
GLOVE SURG UNDER POLY LF SZ7 (GLOVE) ×2 IMPLANT
GOWN STRL REUS W/ TWL LRG LVL3 (GOWN DISPOSABLE) IMPLANT
GOWN STRL REUS W/ TWL XL LVL3 (GOWN DISPOSABLE) IMPLANT
GOWN STRL REUS W/TWL 2XL LVL3 (GOWN DISPOSABLE) ×1 IMPLANT
GOWN STRL REUS W/TWL LRG LVL3 (GOWN DISPOSABLE) ×9
GOWN STRL REUS W/TWL XL LVL3 (GOWN DISPOSABLE) ×3
HEMOSTAT POWDER KIT SURGIFOAM (HEMOSTASIS) ×3 IMPLANT
KIT BASIN OR (CUSTOM PROCEDURE TRAY) ×3 IMPLANT
KIT TURNOVER KIT B (KITS) ×3 IMPLANT
NDL HYPO 25X1 1.5 SAFETY (NEEDLE) ×1 IMPLANT
NDL SPNL 20GX3.5 QUINCKE YW (NEEDLE) ×1 IMPLANT
NEEDLE HYPO 25X1 1.5 SAFETY (NEEDLE) ×3 IMPLANT
NEEDLE SPNL 20GX3.5 QUINCKE YW (NEEDLE) ×3 IMPLANT
NS IRRIG 1000ML POUR BTL (IV SOLUTION) ×3 IMPLANT
PACK LAMINECTOMY NEURO (CUSTOM PROCEDURE TRAY) ×3 IMPLANT
PAD ARMBOARD 7.5X6 YLW CONV (MISCELLANEOUS) ×3 IMPLANT
PIN DISTRACTION 14MM (PIN) ×4 IMPLANT
PLATE ACP 1.6X24 1LVL (Plate) ×2 IMPLANT
SCREW ACP ST VARI 3.5X13 (Screw) ×4 IMPLANT
SCREW ACP VA ST 4X13 (Screw) ×4 IMPLANT
SPONGE INTESTINAL PEANUT (DISPOSABLE) ×3 IMPLANT
SPONGE SURGIFOAM ABS GEL SZ50 (HEMOSTASIS) IMPLANT
STRIP CLOSURE SKIN 1/2X4 (GAUZE/BANDAGES/DRESSINGS) ×2 IMPLANT
SUT VIC AB 3-0 SH 8-18 (SUTURE) ×5 IMPLANT
SUT VICRYL 4-0 PS2 18IN ABS (SUTURE) ×2 IMPLANT
TOWEL GREEN STERILE (TOWEL DISPOSABLE) ×3 IMPLANT
TOWEL GREEN STERILE FF (TOWEL DISPOSABLE) ×3 IMPLANT
WATER STERILE IRR 1000ML POUR (IV SOLUTION) ×3 IMPLANT

## 2021-02-22 NOTE — Op Note (Signed)
02/22/2021  2:49 PM  PATIENT:  Veronica Armstrong  45 y.o. 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 Decompressive anterior cervical discectomy C5-6, 2. Anterior cervical arthrodesis C5-6 utilizing a peek interbody cage packed with locally harvested morcellized autologous bone graft and morselized allograft, 3. Anterior cervical plating C5-6 utilizing a NuVasive plate, 4.  Removal of NuVasive plate B5-5 with exploration of fusion  SURGEON:  Marikay Alar, MD  ASSISTANTS: Verlin Dike FNP  ANESTHESIA:   General  EBL: Less than 50 ml  Total I/O In: 500 [I.V.:500] Out: -   BLOOD ADMINISTERED: none  DRAINS: none  SPECIMEN:  none  INDICATION FOR PROCEDURE: This patient presented with severe right arm pain in a C6 distribution with numbness and tingling in the thumb and index finger. Imaging showed patient level disease C5-6 with a right-sided disc protrusion with foraminal stenosis compressing the right C6 nerve root. The patient tried conservative measures without relief. Pain was debilitating. Recommended ACDF with plating. Patient understood the risks, benefits, and alternatives and potential outcomes and wished to proceed.  PROCEDURE DETAILS: Patient was brought to the operating room placed under general endotracheal anesthesia. Patient was placed in the supine position on the operating room table. The neck was prepped with Duraprep and draped in a sterile fashion.   Three cc of local anesthesia was injected and a transverse incision was made on the right side of the neck.  Dissection was carried down thru the subcutaneous tissue and the platysma was  elevated, opened, and undermined with Metzenbaum scissors.  Dissection was then carried out thru an avascular plane leaving the sternocleidomastoid carotid artery and  jugular vein laterally and the trachea and esophagus medially with the assistance of my nurse practitioner. The ventral aspect of the vertebral column was identified and a localizing x-ray was taken.  The old plate was identified and the soft tissue was removed with blunt dissection and Bovie cautery.  The locking mechanism was opened and the screws were removed and the plate was removed.  The C5-6 level was identified and all in the room agreed with the level. The longus colli muscles were then elevated and the retractor was placed with the assistance of my nurse practitioner. The annulus was incised and the disc space entered. Discectomy was performed with micro-curettes and pituitary rongeurs. I then used the high-speed drill to drill the endplates down to the level of the posterior longitudinal ligament. The drill shavings were saved in a mucous trap for later arthrodesis. The operating microscope was draped and brought into the field provided additional magnification, illumination and visualization. Discectomy was continued posteriorly thru the disc space. Posterior longitudinal ligament was opened with a nerve hook, and then removed along with disc herniation and osteophytes, decompressing the spinal canal and thecal sac. We then continued to remove osteophytic overgrowth and disc material decompressing the neural foramina and exiting nerve roots bilaterally. The scope was angled up and down to help decompress and undercut the vertebral bodies. Once the decompression was completed we could pass a nerve hook circumferentially to assure adequate decompression in the midline and in the neural foramina. So by both visualization and palpation we felt we had an adequate decompression of the neural elements. We then measured the height of the intravertebral disc space and selected a 7 millimeter peek interbody cage packed with autograft and morselized  allograft. It was then gently positioned in the intravertebral  disc space(s) and countersunk. I then used a 24 mm NuVasive plate and placed 10 mm variable angle screws into the vertebral bodies of each level and locked them into position. The wound was irrigated with bacitracin solution, checked for hemostasis which was established and confirmed. Once meticulous hemostasis was achieved, we then proceeded with closure with the assistance of my nurse practitioner. The platysma was closed with interrupted 3-0 undyed Vicryl suture, the subcuticular layer was closed with interrupted 3-0 undyed Vicryl suture. The skin edges were approximated with steristrips. The drapes were removed. A sterile dressing was applied. The patient was then awakened from general anesthesia and transferred to the recovery room in stable condition. At the end of the procedure all sponge, needle and instrument counts were correct.   PLAN OF CARE: Discharge to home after PACU  PATIENT DISPOSITION:  PACU - hemodynamically stable.   Delay start of Pharmacological VTE agent (>24hrs) due to surgical blood loss or risk of bleeding:  yes

## 2021-02-22 NOTE — Transfer of Care (Signed)
Immediate Anesthesia Transfer of Care Note  Patient: Veronica Armstrong  Procedure(s) Performed: Cervical five-six Anterior Cervical Decompression Discectomy Fusion (Neck)  Patient Location: PACU  Anesthesia Type:General  Level of Consciousness: awake and alert   Airway & Oxygen Therapy: Patient Spontanous Breathing and Patient connected to face mask oxygen  Post-op Assessment: Report given to RN and Post -op Vital signs reviewed and stable  Post vital signs: Reviewed and stable  Last Vitals:  Vitals Value Taken Time  BP 127/69 02/22/21 1448  Temp 37 C 02/22/21 1448  Pulse 91 02/22/21 1453  Resp 21 02/22/21 1453  SpO2 96 % 02/22/21 1453  Vitals shown include unvalidated device data.  Last Pain:  Vitals:   02/22/21 1448  TempSrc:   PainSc: 0-No pain         Complications: No notable events documented.

## 2021-02-22 NOTE — H&P (Signed)
Subjective:   Patient is a 45 y.o. female admitted for ACDF. The patient first presented to me with complaints of neck pain, shooting pains in the arm(s), and numbness of the arm(s). Onset of symptoms was several months ago. The pain is described as sharp and occurs all day. The pain is rated intense, and is located in the neck and radiates to the RUE. The symptoms have been progressive. Symptoms are exacerbated by extending head backwards, and are relieved by none.  Previous work up includes MRI of cervical spine, results: disc bulge at C5-C6 right.  Past Medical History:  Diagnosis Date   Benign essential HTN    HLD (hyperlipidemia)     Past Surgical History:  Procedure Laterality Date   CESAREAN SECTION     LAPAROSCOPY     NECK SURGERY     ROTATOR CUFF REPAIR Right    Aug 2022    Allergies  Allergen Reactions   Promethazine Other (See Comments)    Seizure   Augmentin [Amoxicillin-Pot Clavulanate]     Diarrhea   Cefdinir Itching   Erythromycin Hives   Hydrocodone Itching   Other     Pineapple , hives    Social History   Tobacco Use   Smoking status: Never   Smokeless tobacco: Never  Substance Use Topics   Alcohol use: Yes    Alcohol/week: 0.0 standard drinks    Comment: Social    Family History  Problem Relation Age of Onset   Osteoporosis Mother    Parkinson's disease Father    Heart disease Father    Prior to Admission medications   Medication Sig Start Date End Date Taking? Authorizing Provider  amLODipine (NORVASC) 5 MG tablet Take 1 tablet (5 mg total) by mouth daily. 09/07/20  Yes Donita Brooks, MD  buPROPion [facility] SR) 150 MG 12 hr tablet Take 1 tablet by mouth 2 (two) times daily. 03/21/17  Yes [provider]  Cholecalciferol (VITAMIN D) 125 MCG (5000 UT) CAPS Take by mouth.   Yes [provider]  Cyanocobalamin (VITAMIN B 12) 500 MCG TABS Take by mouth.   Yes [provider]  fluticasone (FLONASE) 50 MCG/ACT nasal spray  Place 2 sprays into both nostrils daily. 10/17/20  Yes Donita Brooks, MD  metoprolol succinate (TOPROL-XL) 25 MG 24 hr tablet Take 1 tablet (25 mg total) by mouth daily. 09/07/20  Yes Donita Brooks, MD  norethindrone-ethinyl estradiol (MICROGESTIN,JUNEL,LOESTRIN) 1-20 MG-MCG tablet TAKE 1 TABLET BY MOUTH DAILY. ACTIVE TABLETS ONLY 12/08/14  Yes [provider]  SAXENDA 18 MG/3ML SOPN  12/27/18  Yes [provider]  NOVOFINE PLUS 32G X 4 MM MISC USE 1 D FOR INJECTION WITH SAXENDA 12/21/18   [provider]  vitamin C (ASCORBIC ACID) 500 MG tablet Take 500 mg by mouth daily.    [provider]  zinc gluconate 50 MG tablet Take 50 mg by mouth daily.    [provider]     Review of Systems  Positive ROS: neg  All other systems have been reviewed and were otherwise negative with the exception of those mentioned in the HPI and as above.  Objective: Vital signs in last 24 hours: Temp:  [98.7 F (37.1 C)] 98.7 F (37.1 C) (12/23 1154) Pulse Rate:  [96] 96 (12/23 1154) Resp:  [17] 17 (12/23 1154) BP: (146)/(77) 146/77 (12/23 1154) SpO2:  [100 %] 100 % (12/23 1154) Weight:  [79.4 kg] 79.4 kg (12/23 1154)  General Appearance:  Alert, cooperative, no distress, appears stated age Head: Normocephalic, without obvious abnormality, atraumatic Eyes: PERRL, conjunctiva/corneas clear, EOM's intact      Neck: Supple, symmetrical, trachea midline, Back: Symmetric, no curvature, ROM normal, no CVA tenderness Lungs:  respirations unlabored Heart: Regular rate and rhythm Abdomen: Soft, non-tender Extremities: Extremities normal, atraumatic, no cyanosis or edema Pulses: 2+ and symmetric all extremities Skin: Skin color, texture, turgor normal, no rashes or lesions  NEUROLOGIC:  Mental status: Alert and oriented x4, no aphasia, good attention span, fund of knowledge and memory  Motor Exam - grossly normal Sensory Exam - grossly normal Reflexes:  1+ Coordination - grossly normal Gait - grossly normal Balance - grossly normal Cranial Nerves: I: smell Not tested  II: visual acuity  OS: nl    OD: nl  II: visual fields Full to confrontation  II: pupils Equal, round, reactive to light  III,VII: ptosis None  III,IV,VI: extraocular muscles  Full ROM  V: mastication Normal  V: facial light touch sensation  Normal  V,VII: corneal reflex  Present  VII: facial muscle function - upper  Normal  VII: facial muscle function - lower Normal  VIII: hearing Not tested  IX: soft palate elevation  Normal  IX,X: gag reflex Present  XI: trapezius strength  5/5  XI: sternocleidomastoid strength 5/5  XI: neck flexion strength  5/5  XII: tongue strength  Normal    Data Review Lab Results  Component Value Date   WBC 8.0 02/22/2021   HGB 14.1 02/22/2021   HCT 43.7 02/22/2021   MCV 92.0 02/22/2021   PLT 321 02/22/2021   Lab Results  Component Value Date   NA 135 02/22/2021   K 3.7 02/22/2021   CL 106 02/22/2021   CO2 21 (L) 02/22/2021   BUN 10 02/22/2021   CREATININE 0.89 02/22/2021   GLUCOSE 92 02/22/2021   No results found for: INR, PROTIME  Assessment:   Cervical neck pain with herniated nucleus pulposus/ spondylosis/ 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 Estimated body mass index is 32.01 kg/m as calculated from the following:   Height as of this encounter: 5\' 2"  (1.575 m).   Weight as of this encounter: 79.4 kg.  Patient has failed conservative therapy. Planned surgery : ACDF c5-6, removal plate  Plan:   I explained the condition and procedure to the patient and answered any questions.  Patient wishes to proceed with procedure as planned. Understands risks/ benefits/ and expected or typical outcomes.  ASTRYD PEARCY 02/22/2021 1:03 PM

## 2021-02-22 NOTE — Anesthesia Preprocedure Evaluation (Signed)
Anesthesia Evaluation  Patient identified by MRN, date of birth, ID band Patient awake    Reviewed: Allergy & Precautions, H&P , NPO status , Patient's Chart, lab work & pertinent test results  Airway Mallampati: II   Neck ROM: full    Dental   Pulmonary neg pulmonary ROS,    breath sounds clear to auscultation       Cardiovascular hypertension,  Rhythm:regular Rate:Normal     Neuro/Psych    GI/Hepatic   Endo/Other    Renal/GU      Musculoskeletal   Abdominal   Peds  Hematology   Anesthesia Other Findings   Reproductive/Obstetrics                             Anesthesia Physical Anesthesia Plan  ASA: 2  Anesthesia Plan: General   Post-op Pain Management:    Induction: Intravenous  PONV Risk Score and Plan: 3 and Ondansetron, Dexamethasone, Midazolam and Treatment may vary due to age or medical condition  Airway Management Planned: Oral ETT  Additional Equipment:   Intra-op Plan:   Post-operative Plan: Extubation in OR  Informed Consent: I have reviewed the patients History and Physical, chart, labs and discussed the procedure including the risks, benefits and alternatives for the proposed anesthesia with the patient or authorized representative who has indicated his/her understanding and acceptance.     Dental advisory given  Plan Discussed with: CRNA, Surgeon and Anesthesiologist  Anesthesia Plan Comments:         Anesthesia Quick Evaluation

## 2021-02-22 NOTE — Anesthesia Procedure Notes (Signed)
Procedure Name: Intubation Date/Time: 02/22/2021 1:22 PM Performed by: Zollie Beckers, CRNA Pre-anesthesia Checklist: Patient identified, Emergency Drugs available, Suction available and Patient being monitored Patient Re-evaluated:Patient Re-evaluated prior to induction Oxygen Delivery Method: Circle system utilized Preoxygenation: Pre-oxygenation with 100% oxygen Induction Type: IV induction Ventilation: Mask ventilation without difficulty Laryngoscope Size: Glidescope and 3 Grade View: Grade I Tube type: Oral Tube size: 7.0 mm Number of attempts: 1 Airway Equipment and Method: Rigid stylet Placement Confirmation: ETT inserted through vocal cords under direct vision, positive ETCO2 and breath sounds checked- equal and bilateral Secured at: 22 cm Tube secured with: Tape Dental Injury: Teeth and Oropharynx as per pre-operative assessment  Comments: Elective glidescope intubation for ACDF

## 2021-02-24 NOTE — Anesthesia Postprocedure Evaluation (Signed)
Anesthesia Post Note  Patient: Veronica Armstrong  Procedure(s) Performed: Cervical five-six Anterior Cervical Decompression Discectomy Fusion (Neck)     Patient location during evaluation: PACU Anesthesia Type: General Level of consciousness: awake and alert Pain management: pain level controlled Vital Signs Assessment: post-procedure vital signs reviewed and stable Respiratory status: spontaneous breathing, nonlabored ventilation, respiratory function stable and patient connected to nasal cannula oxygen Cardiovascular status: blood pressure returned to baseline and stable Postop Assessment: no apparent nausea or vomiting Anesthetic complications: no   No notable events documented.  Last Vitals:  Vitals:   02/22/21 1503 02/22/21 1518  BP: 122/64 129/69  Pulse: 86 87  Resp: (!) 23 20  Temp:  37.1 C  SpO2: 93% 94%    Last Pain:  Vitals:   02/22/21 1518  TempSrc:   PainSc: 0-No pain                 Barbera Perritt S

## 2021-02-27 ENCOUNTER — Encounter (HOSPITAL_COMMUNITY): Payer: Self-pay | Admitting: Neurological Surgery

## 2021-04-03 DIAGNOSIS — R7309 Other abnormal glucose: Secondary | ICD-10-CM | POA: Diagnosis not present

## 2021-04-09 DIAGNOSIS — M5412 Radiculopathy, cervical region: Secondary | ICD-10-CM | POA: Diagnosis not present

## 2021-05-01 DIAGNOSIS — R7309 Other abnormal glucose: Secondary | ICD-10-CM | POA: Diagnosis not present

## 2021-06-01 DIAGNOSIS — R7309 Other abnormal glucose: Secondary | ICD-10-CM | POA: Diagnosis not present

## 2021-06-09 DIAGNOSIS — J020 Streptococcal pharyngitis: Secondary | ICD-10-CM | POA: Diagnosis not present

## 2021-06-26 ENCOUNTER — Ambulatory Visit (INDEPENDENT_AMBULATORY_CARE_PROVIDER_SITE_OTHER): Payer: BC Managed Care – PPO

## 2021-06-26 ENCOUNTER — Ambulatory Visit: Payer: BC Managed Care – PPO | Admitting: Podiatry

## 2021-06-26 DIAGNOSIS — M722 Plantar fascial fibromatosis: Secondary | ICD-10-CM

## 2021-06-26 DIAGNOSIS — G5620 Lesion of ulnar nerve, unspecified upper limb: Secondary | ICD-10-CM | POA: Insufficient documentation

## 2021-06-26 MED ORDER — MELOXICAM 15 MG PO TABS: 15.0000 mg | ORAL_TABLET | Freq: Every day | ORAL | 3 refills | Status: DC

## 2021-06-26 MED ORDER — METHYLPREDNISOLONE 4 MG PO TBPK: ORAL_TABLET | ORAL | 0 refills | Status: DC

## 2021-06-26 NOTE — Patient Instructions (Signed)
Plantar Fasciitis Rehab Ask your health care provider which exercises are safe for you. Do exercises exactly as told by your health care provider and adjust them as directed. It is normal to feel mild stretching, pulling, tightness, or discomfort as you do these exercises. Stop right away if you feel sudden pain or your pain gets worse. Do not begin these exercises until told by your health care provider. Stretching and range-of-motion exercises These exercises warm up your muscles and joints and improve the movement and flexibility of your foot. These exercises also help to relieve pain. Plantar fascia stretch  Sit with your left / right leg crossed over your opposite knee. Hold your heel with one hand with that thumb near your arch. With your other hand, hold your toes and gently pull them back toward the top of your foot. You should feel a stretch on the base (bottom) of your toes, or the bottom of your foot (plantar fascia), or both. Hold this stretch for__________ seconds. Slowly release your toes and return to the starting position. Repeat __________ times. Complete this exercise __________ times a day. Gastrocnemius stretch, standing This exercise is also called a calf (gastroc) stretch. It stretches the muscles in the back of the upper calf. Stand with your hands against a wall. Extend your left / right leg behind you, and bend your front knee slightly. Keeping your heels on the floor, your toes facing forward, and your back knee straight, shift your weight toward the wall. Do not arch your back. You should feel a gentle stretch in your upper calf. Hold this position for __________ seconds. Repeat __________ times. Complete this exercise __________ times a day. Soleus stretch, standing This exercise is also called a calf (soleus) stretch. It stretches the muscles in the back of the lower calf. Stand with your hands against a wall. Extend your left / right leg behind you, and bend your  front knee slightly. Keeping your heels on the floor and your toes facing forward, bend your back knee and shift your weight slightly over your back leg. You should feel a gentle stretch deep in your lower calf. Hold this position for __________ seconds. Repeat __________ times. Complete this exercise __________ times a day. Gastroc and soleus stretch, standing step This exercise stretches the muscles in the back of the lower leg. These muscles are in the upper calf (gastrocnemius) and the lower calf (soleus). Stand with the ball of your left / right foot on the front of a step. The ball of your foot is on the walking surface, right under your toes. Keep your other foot firmly on the same step. Hold on to the wall or a railing for balance. Slowly lift your other foot, allowing your body weight to press your heel down over the edge of the front of the step. Keep knee straight and unbent. You should feel a stretch in your calf. Hold this position for __________ seconds. Return both feet to the step. Repeat this exercise with a slight bend in your left / right knee. Repeat __________ times with your left / right knee straight and __________ times with your left / right knee bent. Complete this exercise __________ times a day. Balance exercise This exercise builds your balance and strength control of your arch to help take pressure off your plantar fascia. Single leg stand If this exercise is too easy, you can try it with your eyes closed or while standing on a pillow. Without shoes, stand near a   railing or in a doorway. You may hold on to the railing or door frame as needed. ?Stand on your left / right foot. Keep your big toe down on the floor and lift the arch of your foot. You should feel a stretch across the bottom of your foot and your arch. Do not let your foot roll inward. ?Hold this position for __________ seconds. ?Repeat __________ times. Complete this exercise __________ times a day. ?This  information is not intended to replace advice given to you by your health care provider. Make sure you discuss any questions you have with your health care provider. ?Document Revised: 12/01/2019 Document Reviewed: 12/01/2019 ?Elsevier Patient Education ? 2023 [employer] ?Plantar Fasciitis ? ?Plantar fasciitis is a painful foot condition that affects the heel. It occurs when the band of tissue that connects the toes to the heel bone (plantar fascia) becomes irritated. This can happen as the result of exercising too much or doing other repetitive activities (overuse injury). ?Plantar fasciitis can cause mild irritation to severe pain that makes it difficult to walk or move. The pain is usually worse in the morning after sleeping, or after sitting or lying down for a period of time. Pain may also be worse after long periods of walking or standing. ?What are the causes? ?This condition may be caused by: ?Standing for long periods of time. ?Wearing shoes that do not have good arch support. ?Doing activities that put stress on joints (high-impact activities). This includes ballet and exercise that makes your heart beat faster (aerobic exercise), such as running. ?Being overweight. ?An abnormal way of walking (gait). ?Tight muscles in the back of your lower leg (calf). ?High arches in your feet or flat feet. ?Starting a new athletic activity. ?What are the signs or symptoms? ?The main symptom of this condition is heel pain. Pain may get worse after the following: ?Taking the first steps after a time of rest, especially in the morning after awakening, or after you have been sitting or lying down for a while. ?Long periods of standing still. ?Pain may decrease after 30-45 minutes of activity, such as gentle walking. ?How is this diagnosed? ?This condition may be diagnosed based on your medical history, a physical exam, and your symptoms. Your health care provider will check for: ?A tender area on the bottom of your  foot. ?A high arch in your foot or flat feet. ?Pain when you move your foot. ?Difficulty moving your foot. ?You may have imaging tests to confirm the diagnosis, such as: ?X-rays. ?Ultrasound. ?MRI. ?How is this treated? ?Treatment for plantar fasciitis depends on how severe your condition is. Treatment may include: ?Rest, ice, pressure (compression), and raising (elevating) the affected foot. This is called RICE therapy. Your health care provider may recommend RICE therapy along with over-the-counter pain medicines to manage your pain. ?Exercises to stretch your calves and your plantar fascia. ?A splint that holds your foot in a stretched, upward position while you sleep (night splint). ?Physical therapy to relieve symptoms and prevent problems in the future. ?Injections of steroid medicine (cortisone) to relieve pain and inflammation. ?Stimulating your plantar fascia with electrical impulses (extracorporeal shock wave therapy). This is usually the last treatment option before surgery. ?Surgery, if other treatments have not worked after 12 months. ?Follow these instructions at home: ?Managing pain, stiffness, and swelling ? ?If directed, put ice on the painful area. To do this: ?Put ice in a plastic bag, or use a frozen bottle of water. ?Place a towel  between your skin and the bag or bottle. ?Roll the bottom of your foot over the bag or bottle. ?Do this for 20 minutes, 2-3 times a day. ?Wear athletic shoes that have air-sole or gel-sole cushions, or try soft shoe inserts that are designed for plantar fasciitis. ?Elevate your foot above the level of your heart while you are sitting or lying down. ?Activity ?Avoid activities that cause pain. Ask your health care provider what activities are safe for you. ?Do physical therapy exercises and stretches as told by your health care provider. ?Try activities and forms of exercise that are easier on your joints (low impact). Examples include swimming, water aerobics, and  biking. ?General instructions ?Take over-the-counter and prescription medicines only as told by your health care provider. ?Wear a night splint while sleeping, if told by your health care provider. Loosen the spl

## 2021-06-27 NOTE — Progress Notes (Signed)
?Subjective:  ?Patient ID: Veronica Armstrong, female    DOB: [birth year],  MRN: [id number] ?HPI ?Chief Complaint  ?Patient presents with  ? Foot Pain  ?  Patient presents today for right heel pain that radiates around to top of foot x 3-4 months.  She says its a burning, pulling pain and its worse by night.  "It feels like Im stepping on a deep bruise"   She has used epson salt baths and Advil for relief  ? ? ?46 y.o. female presents with the above complaint.  ? ?ROS: Denies fever chills nausea vomiting muscle aches pains calf pain back pain chest pain shortness of breath. ? ?Past Medical History:  ?Diagnosis Date  ? Benign essential HTN   ? HLD (hyperlipidemia)   ? ?Past Surgical History:  ?Procedure Laterality Date  ? ANTERIOR CERVICAL DECOMP/DISCECTOMY FUSION N/A 02/22/2021  ? Procedure: Cervical five-six Anterior Cervical Decompression Discectomy Fusion;  Surgeon: Tia Alert, MD;  Location: [facility] OR;  Service: Neurosurgery;  Laterality: N/A;  ? CESAREAN SECTION    ? LAPAROSCOPY    ? NECK SURGERY    ? ROTATOR CUFF REPAIR Right   ? Aug 2022  ? ? ?Current Outpatient Medications:  ?  meloxicam (MOBIC) 15 MG tablet, Take 1 tablet (15 mg total) by mouth daily., Disp: 30 tablet, Rfl: 3 ?  methylPREDNISolone (MEDROL DOSEPAK) 4 MG TBPK tablet, 6 day dose pack - take as directed, Disp: 21 tablet, Rfl: 0 ?  amLODipine (NORVASC) 5 MG tablet, Take 1 tablet (5 mg total) by mouth daily., Disp: 90 tablet, Rfl: 3 ?  buPROPion (WELLBUTRIN SR) 150 MG 12 hr tablet, Take 1 tablet by mouth 2 (two) times daily., Disp: , Rfl: 2 ?  buPROPion (WELLBUTRIN SR) 200 MG 12 hr tablet, Take 200 mg by mouth 2 (two) times daily., Disp: , Rfl:  ?  Cholecalciferol (VITAMIN D) 125 MCG (5000 UT) CAPS, Take by mouth., Disp: , Rfl:  ?  Cyanocobalamin (VITAMIN B 12) 500 MCG TABS, Take by mouth., Disp: , Rfl:  ?  fluticasone (FLONASE) 50 MCG/ACT nasal spray, Place 2 sprays into both nostrils daily., Disp: 48 g, Rfl: 3 ?  gabapentin (NEURONTIN) 100 MG  capsule, Take by mouth., Disp: , Rfl:  ?  Melatonin 10 MG TABS, , Disp: , Rfl:  ?  methocarbamol (ROBAXIN) 500 MG tablet, Take 1 tablet (500 mg total) by mouth every 6 (six) hours as needed for muscle spasms., Disp: 60 tablet, Rfl: 1 ?  metoprolol succinate (TOPROL-XL) 25 MG 24 hr tablet, Take 1 tablet (25 mg total) by mouth daily., Disp: 90 tablet, Rfl: 3 ?  metoprolol tartrate (LOPRESSOR) 25 MG tablet, , Disp: , Rfl:  ?  norethindrone-ethinyl estradiol (MICROGESTIN,JUNEL,LOESTRIN) 1-20 MG-MCG tablet, TAKE 1 TABLET BY MOUTH DAILY. ACTIVE TABLETS ONLY, Disp: , Rfl: 4 ?  NOVOFINE PLUS 32G X 4 MM MISC, USE 1 D FOR INJECTION WITH SAXENDA, Disp: , Rfl:  ?  oxyCODONE (OXY IR/ROXICODONE) 5 MG immediate release tablet, Take 1 tablet (5 mg total) by mouth every 4 (four) hours as needed (for pain score of 1-4)., Disp: 30 tablet, Rfl: 0 ?  SAXENDA 18 MG/3ML SOPN, , Disp: , Rfl:  ?  vitamin C (ASCORBIC ACID) 500 MG tablet, Take 500 mg by mouth daily., Disp: , Rfl:  ?  zinc gluconate 50 MG tablet, Take 50 mg by mouth daily., Disp: , Rfl:  ? ?Allergies  ?Allergen Reactions  ? Promethazine Other (See Comments)  ?  Seizure  ? Acetaminophen Itching  ? Augmentin [Amoxicillin-Pot Clavulanate]   ?  Diarrhea  ? Cefdinir Itching  ? Erythromycin Hives  ? Hydrocodone Itching  ? Other   ?  Pineapple , hives  ? ?Review of Systems ?Objective:  ?There were no vitals filed for this visit. ? ?General: Well developed, nourished, in no acute distress, alert and oriented x3  ? ?Dermatological: Skin is warm, dry and supple bilateral. Nails x 10 are well maintained; remaining integument appears unremarkable at this time. There are no open sores, no preulcerative lesions, no rash or signs of infection present. ? ?Vascular: Dorsalis Pedis artery and Posterior Tibial artery pedal pulses are 2/4 bilateral with immedate capillary fill time. Pedal hair growth present. No varicosities and no lower extremity edema present bilateral.  ? ?Neruologic: Grossly  intact via light touch bilateral. Vibratory intact via tuning fork bilateral. Protective threshold with Semmes Wienstein monofilament intact to all pedal sites bilateral. Patellar and Achilles deep tendon reflexes 2+ bilateral. No Babinski or clonus noted bilateral.  ? ?Musculoskeletal: No gross boney pedal deformities bilateral. No pain, crepitus, or limitation noted with foot and ankle range of motion bilateral. Muscular strength 5/5 in all groups tested bilateral.  Pain on palpation medial calcaneal tubercle of the right foot.  No pain on medial-lateral compression of the calcaneus. ? ?Gait: Unassisted, Nonantalgic.  ? ? ?Radiographs: ? ?Radiographs taken today demonstrate an osseously mature individual no significant osseous abnormalities.  She does have mild pes planus.  Some soft tissue increase in density at the plantar fascial calcaneal insertion site large dieters process of the posterior lateral talus.  No acute findings. ? ?Assessment & Plan:  ? ?Assessment: Planter fasciitis right. ? ?Plan: Discussed etiology pathology conservative surgical therapies.  Injected with 20 mg Kenalog 5 mg Marcaine.  Start her on methylprednisolone to be followed by meloxicam.  Placed in a plantar fascia brace and a night splint.  Discussed appropriate shoe gear stretching exercises ice therapy and shoe gear modifications.  Follow-up with her in 1 month ? ? ? ? ?Emree Locicero T. [place], DPM ?

## 2021-07-01 DIAGNOSIS — Z6831 Body mass index (BMI) 31.0-31.9, adult: Secondary | ICD-10-CM | POA: Diagnosis not present

## 2021-07-01 DIAGNOSIS — Z713 Dietary counseling and surveillance: Secondary | ICD-10-CM | POA: Diagnosis not present

## 2021-07-01 DIAGNOSIS — R7309 Other abnormal glucose: Secondary | ICD-10-CM | POA: Diagnosis not present

## 2021-07-09 DIAGNOSIS — Z6832 Body mass index (BMI) 32.0-32.9, adult: Secondary | ICD-10-CM | POA: Diagnosis not present

## 2021-07-09 DIAGNOSIS — M5412 Radiculopathy, cervical region: Secondary | ICD-10-CM | POA: Diagnosis not present

## 2021-07-24 ENCOUNTER — Encounter: Payer: BC Managed Care – PPO | Admitting: Podiatry

## 2021-07-30 ENCOUNTER — Ambulatory Visit (INDEPENDENT_AMBULATORY_CARE_PROVIDER_SITE_OTHER): Payer: BC Managed Care – PPO | Admitting: Family Medicine

## 2021-07-30 ENCOUNTER — Ambulatory Visit: Payer: BC Managed Care – PPO | Admitting: Family Medicine

## 2021-07-30 ENCOUNTER — Encounter: Payer: Self-pay | Admitting: Family Medicine

## 2021-07-30 ENCOUNTER — Other Ambulatory Visit: Payer: Self-pay

## 2021-07-30 VITALS — BP 132/80 | HR 83 | Ht 62.0 in | Wt 176.0 lb

## 2021-07-30 DIAGNOSIS — Z1211 Encounter for screening for malignant neoplasm of colon: Secondary | ICD-10-CM

## 2021-07-30 DIAGNOSIS — Z0000 Encounter for general adult medical examination without abnormal findings: Secondary | ICD-10-CM | POA: Diagnosis not present

## 2021-07-30 DIAGNOSIS — I10 Essential (primary) hypertension: Secondary | ICD-10-CM

## 2021-07-30 DIAGNOSIS — E7800 Pure hypercholesterolemia, unspecified: Secondary | ICD-10-CM

## 2021-07-30 MED ORDER — NA SULFATE-K SULFATE-MG SULF 17.5-3.13-1.6 GM/177ML PO SOLN: 1.0000 | Freq: Once | ORAL | 0 refills | Status: AC

## 2021-07-30 NOTE — Progress Notes (Signed)
Gastroenterology Pre-Procedure Review  Request Date: 11/18/2021 Requesting Physician: Dr. Allegra Lai  PATIENT REVIEW QUESTIONS: The patient responded to the following health history questions as indicated:    1. Are you having any GI issues? no 2. Do you have a personal history of Polyps? no 3. Do you have a family history of Colon Cancer or Polyps? no 4. Diabetes Mellitus? no 5. Joint replacements in the past 12 months?no 6. Major health problems in the past 3 months?no 7. Any artificial heart valves, MVP, or defibrillator?no    MEDICATIONS & ALLERGIES:    Patient reports the following regarding taking any anticoagulation/antiplatelet therapy:   Plavix, Coumadin, Eliquis, Xarelto, Lovenox, Pradaxa, Brilinta, or Effient? no Aspirin? no  Patient confirms/reports the following medications:  Current Outpatient Medications  Medication Sig Dispense Refill   amLODipine (NORVASC) 5 MG tablet Take 1 tablet (5 mg total) by mouth daily. 90 tablet 3   buPROPion (WELLBUTRIN SR) 150 MG 12 hr tablet Take 1 tablet by mouth 2 (two) times daily.  2   buPROPion (WELLBUTRIN SR) 200 MG 12 hr tablet Take 200 mg by mouth 2 (two) times daily.     Cholecalciferol (VITAMIN D) 125 MCG (5000 UT) CAPS Take by mouth.     Cyanocobalamin (VITAMIN B 12) 500 MCG TABS Take by mouth.     fluticasone (FLONASE) 50 MCG/ACT nasal spray Place 2 sprays into both nostrils daily. 48 g 3   Melatonin 10 MG TABS      meloxicam (MOBIC) 15 MG tablet Take 1 tablet (15 mg total) by mouth daily. 30 tablet 3   methocarbamol (ROBAXIN) 500 MG tablet Take 1 tablet (500 mg total) by mouth every 6 (six) hours as needed for muscle spasms. 60 tablet 1   metoprolol succinate (TOPROL-XL) 25 MG 24 hr tablet Take 1 tablet (25 mg total) by mouth daily. 90 tablet 3   norethindrone-ethinyl estradiol (MICROGESTIN,JUNEL,LOESTRIN) 1-20 MG-MCG tablet TAKE 1 TABLET BY MOUTH DAILY. ACTIVE TABLETS ONLY  4   NOVOFINE PLUS 32G X 4 MM MISC USE 1 D FOR INJECTION  WITH SAXENDA     vitamin C (ASCORBIC ACID) 500 MG tablet Take 500 mg by mouth daily.     zinc gluconate 50 MG tablet Take 50 mg by mouth daily.     No current facility-administered medications for this visit.    Patient confirms/reports the following allergies:  Allergies  Allergen Reactions   Promethazine Other (See Comments)    Seizure   Acetaminophen Itching   Augmentin [Amoxicillin-Pot Clavulanate]     Diarrhea   Cefdinir Itching   Erythromycin Hives   Hydrocodone Itching   Other     Pineapple , hives    No orders of the defined types were placed in this encounter.   AUTHORIZATION INFORMATION Primary Insurance: 1D#: Group #:  Secondary Insurance: 1D#: Group #:  SCHEDULE INFORMATION: Date: 11/18/2021 Time: Location:[facility]

## 2021-07-30 NOTE — Progress Notes (Signed)
Subjective:    Patient ID: Veronica Armstrong, female    DOB: [birth year], 46 y.o.   MRN: [id number]  HPI Patient is here today for complete physical exam.  She is due for colonoscopy.  Her mammogram was performed last fall at her gynecologist office as well as a Pap smear.  Both were normal.  Otherwise she is doing well.  She denies any chest pain or shortness of breath.  She recently had neck surgery due to cervical radiculopathy.  Her neck pain and arm pain have improved dramatically.  She is no longer having to take pain medication although she does take meloxicam. Past Medical History:  Diagnosis Date  . Benign essential HTN   . HLD (hyperlipidemia)    Past Surgical History:  Procedure Laterality Date  . ANTERIOR CERVICAL DECOMP/DISCECTOMY FUSION N/A 02/22/2021   Procedure: Cervical five-six Anterior Cervical Decompression Discectomy Fusion;  Surgeon: Eustace Moore, MD;  Location: [place];  Service: Neurosurgery;  Laterality: N/A;  . CESAREAN SECTION    . LAPAROSCOPY    . NECK SURGERY    . ROTATOR CUFF REPAIR Right    Aug 2022   Current Outpatient Medications on File Prior to Visit  Medication Sig Dispense Refill  . amLODipine (NORVASC) 5 MG tablet Take 1 tablet (5 mg total) by mouth daily. 90 tablet 3  . buPROPion (WELLBUTRIN SR) 150 MG 12 hr tablet Take 1 tablet by mouth 2 (two) times daily.  2  . buPROPion (WELLBUTRIN SR) 200 MG 12 hr tablet Take 200 mg by mouth 2 (two) times daily.    . Cholecalciferol (VITAMIN D) 125 MCG (5000 UT) CAPS Take by mouth.    . Cyanocobalamin (VITAMIN B 12) 500 MCG TABS Take by mouth.    . fluticasone (FLONASE) 50 MCG/ACT nasal spray Place 2 sprays into both nostrils daily. 48 g 3  . Melatonin 10 MG TABS     . meloxicam (MOBIC) 15 MG tablet Take 1 tablet (15 mg total) by mouth daily. 30 tablet 3  . methocarbamol (ROBAXIN) 500 MG tablet Take 1 tablet (500 mg total) by mouth every 6 (six) hours as needed for muscle spasms. 60 tablet 1  . metoprolol  succinate (TOPROL-XL) 25 MG 24 hr tablet Take 1 tablet (25 mg total) by mouth daily. 90 tablet 3  . norethindrone-ethinyl estradiol (MICROGESTIN,JUNEL,LOESTRIN) 1-20 MG-MCG tablet TAKE 1 TABLET BY MOUTH DAILY. ACTIVE TABLETS ONLY  4  . NOVOFINE PLUS 32G X 4 MM MISC USE 1 D FOR INJECTION WITH SAXENDA    . vitamin C (ASCORBIC ACID) 500 MG tablet Take 500 mg by mouth daily.    Marland Kitchen zinc gluconate 50 MG tablet Take 50 mg by mouth daily.     No current facility-administered medications on file prior to visit.   Allergies  Allergen Reactions  . Promethazine Other (See Comments)    Seizure  . Acetaminophen Itching  . Augmentin [Amoxicillin-Pot Clavulanate]     Diarrhea  . Cefdinir Itching  . Erythromycin Hives  . Hydrocodone Itching  . Other     Pineapple , hives   Social History   Socioeconomic History  . Marital status: Married    Spouse name: Not on file  . Number of children: Not on file  . Years of education: Not on file  . Highest education level: Not on file  Occupational History  . Not on file  Tobacco Use  . Smoking status: Never  . Smokeless tobacco: Never  Vaping Use  .  Vaping Use: Never used  Substance and Sexual Activity  . Alcohol use: Yes    Alcohol/week: 0.0 standard drinks    Comment: Social  . Drug use: No  . Sexual activity: Not on file  Other Topics Concern  . Not on file  Social History Narrative  . Not on file   Social Determinants of Health   Financial Resource Strain: Not on file  Food Insecurity: Not on file  Transportation Needs: Not on file  Physical Activity: Not on file  Stress: Not on file  Social Connections: Not on file  Intimate Partner Violence: Not on file      Review of Systems     Objective:   Physical Exam Constitutional:      General: She is not in acute distress.    Appearance: Normal appearance. She is normal weight. She is not diaphoretic.  HENT:     Right Ear: Tympanic membrane and ear canal normal.     Left Ear:  Tympanic membrane normal.     Nose: Nose normal. No congestion or rhinorrhea.     Mouth/Throat:     Mouth: Mucous membranes are moist.     Pharynx: No oropharyngeal exudate.  Eyes:     Extraocular Movements: Extraocular movements intact.     Conjunctiva/sclera: Conjunctivae normal.     Pupils: Pupils are equal, round, and reactive to light.  Neck:     Thyroid: No thyromegaly.     Vascular: No carotid bruit or JVD.  Cardiovascular:     Rate and Rhythm: Normal rate and regular rhythm.     Heart sounds: Normal heart sounds. No murmur heard.   No friction rub. No gallop.  Pulmonary:     Effort: Pulmonary effort is normal. No respiratory distress.     Breath sounds: Normal breath sounds. No stridor. No wheezing, rhonchi or rales.  Abdominal:     General: Abdomen is flat. Bowel sounds are normal.     Palpations: Abdomen is soft.  Musculoskeletal:        General: Normal range of motion.     Cervical back: Neck supple. No rigidity.     Right lower leg: No edema.     Left lower leg: No edema.  Lymphadenopathy:     Cervical: No cervical adenopathy.  Skin:    General: Skin is warm.     Coloration: Skin is not jaundiced or pale.     Findings: No bruising, erythema, lesion or rash.  Neurological:     General: No focal deficit present.     Mental Status: She is alert and oriented to person, place, and time. Mental status is at baseline.     Gait: Gait normal.     Deep Tendon Reflexes: Reflexes normal.  Psychiatric:        Mood and Affect: Mood normal.        Behavior: Behavior normal.        Thought Content: Thought content normal.        Judgment: Judgment normal.          Assessment & Plan:  Colon cancer screening - Plan: Ambulatory referral to Gastroenterology  Benign essential HTN - Plan: CBC with Differential/Platelet, Lipid panel, COMPLETE METABOLIC PANEL WITH GFR  Pure hypercholesterolemia  General medical exam Mammogram is up-to-date.  Schedule colonoscopy.  Pap  smear is up-to-date.  Blood pressure is excellent.  Check CBC CMP and fasting lipid panel.  Goal LDL cholesterol is less than 100.

## 2021-07-31 LAB — CBC WITH DIFFERENTIAL/PLATELET
Absolute Monocytes: 649 cells/uL (ref 200–950)
Basophils Absolute: 56 cells/uL (ref 0–200)
Basophils Relative: 0.6 %
Eosinophils Absolute: 103 cells/uL (ref 15–500)
Eosinophils Relative: 1.1 %
HCT: 42.9 % (ref 35.0–45.0)
Hemoglobin: 14.3 g/dL (ref 11.7–15.5)
Lymphs Abs: 2453 cells/uL (ref 850–3900)
MCH: 29.4 pg (ref 27.0–33.0)
MCHC: 33.3 g/dL (ref 32.0–36.0)
MCV: 88.3 fL (ref 80.0–100.0)
MPV: 9.7 fL (ref 7.5–12.5)
Monocytes Relative: 6.9 %
Neutro Abs: 6138 cells/uL (ref 1500–7800)
Neutrophils Relative %: 65.3 %
Platelets: 375 10*3/uL (ref 140–400)
RBC: 4.86 10*6/uL (ref 3.80–5.10)
RDW: 12.2 % (ref 11.0–15.0)
Total Lymphocyte: 26.1 %
WBC: 9.4 10*3/uL (ref 3.8–10.8)

## 2021-07-31 LAB — COMPLETE METABOLIC PANEL WITH GFR
AG Ratio: 1.8 (calc) (ref 1.0–2.5)
ALT: 12 U/L (ref 6–29)
AST: 14 U/L (ref 10–35)
Albumin: 4.4 g/dL (ref 3.6–5.1)
Alkaline phosphatase (APISO): 78 U/L (ref 31–125)
BUN: 12 mg/dL (ref 7–25)
CO2: 21 mmol/L (ref 20–32)
Calcium: 9.5 mg/dL (ref 8.6–10.2)
Chloride: 105 mmol/L (ref 98–110)
Creat: 0.92 mg/dL (ref 0.50–0.99)
Globulin: 2.4 g/dL (calc) (ref 1.9–3.7)
Glucose, Bld: 85 mg/dL (ref 65–99)
Potassium: 4.8 mmol/L (ref 3.5–5.3)
Sodium: 139 mmol/L (ref 135–146)
Total Bilirubin: 0.3 mg/dL (ref 0.2–1.2)
Total Protein: 6.8 g/dL (ref 6.1–8.1)
eGFR: 78 mL/min/{1.73_m2} (ref >=60)

## 2021-07-31 LAB — LIPID PANEL
Cholesterol: 214 mg/dL — ABNORMAL HIGH (ref <200)
HDL: 73 mg/dL (ref >=50)
LDL Cholesterol (Calc): 118 mg/dL (calc) — ABNORMAL HIGH
Non-HDL Cholesterol (Calc): 141 mg/dL (calc) — ABNORMAL HIGH (ref <130)
Total CHOL/HDL Ratio: 2.9 (calc) (ref <5.0)
Triglycerides: 123 mg/dL (ref <150)

## 2021-08-01 DIAGNOSIS — R7309 Other abnormal glucose: Secondary | ICD-10-CM | POA: Diagnosis not present

## 2021-08-09 DIAGNOSIS — Z713 Dietary counseling and surveillance: Secondary | ICD-10-CM | POA: Diagnosis not present

## 2021-08-09 DIAGNOSIS — Z6830 Body mass index (BMI) 30.0-30.9, adult: Secondary | ICD-10-CM | POA: Diagnosis not present

## 2021-08-17 ENCOUNTER — Other Ambulatory Visit: Payer: Self-pay | Admitting: Family Medicine

## 2021-08-17 DIAGNOSIS — I10 Essential (primary) hypertension: Secondary | ICD-10-CM

## 2021-08-19 NOTE — Telephone Encounter (Signed)
Requested Prescriptions  Pending Prescriptions Disp Refills  . metoprolol succinate (TOPROL-XL) 25 MG 24 hr tablet [Pharmacy Med Name: Metoprolol Succinate 25mg  Extended-Release Tablet] 90 tablet 3    Sig: Take 1 tablet by mouth daily.     Cardiovascular:  Beta Blockers Failed - 08/19/2021 11:34 AM      Failed - Valid encounter within last 6 months    Recent Outpatient Visits          2 weeks ago Colon cancer screening   [facility], MD   11 months ago Benign essential HTN   [facility], Tanya Nones, MD   2 years ago Benign essential HTN   [facility], Tanya Nones, MD   4 years ago Chronic sinusitis, unspecified location   [facility], MD   4 years ago Acute non-recurrent sinusitis, unspecified location   [facility] B, PA-C             Passed - Last BP in normal range    BP Readings from Last 1 Encounters:  07/30/21 132/80         Passed - Last Heart Rate in normal range    Pulse Readings from Last 1 Encounters:  07/30/21 83         . amLODipine (NORVASC) 5 MG tablet [Pharmacy Med Name: Amlodipine Besylate 5mg  Tablet] 90 tablet 3    Sig: Take 1 tablet by mouth daily.     Cardiovascular: Calcium Channel Blockers 2 Failed - 08/19/2021 11:34 AM      Failed - Valid encounter within last 6 months    Recent Outpatient Visits          2 weeks ago Colon cancer screening   [facility] 08/21/2021, MD   11 months ago Benign essential HTN   [facility] Donita Brooks, [facility], MD   2 years ago Benign essential HTN   [facility] Priscille Heidelberg, [facility], MD   4 years ago Chronic sinusitis, unspecified location   [facility] Priscille Heidelberg, MD   4 years ago Acute non-recurrent sinusitis, unspecified location   [facility] Donita Brooks B, PA-C              Passed - Last BP in normal range    BP Readings from Last 1 Encounters:  07/30/21 132/80         Passed - Last Heart Rate in normal range    Pulse Readings from Last 1 Encounters:  07/30/21 83

## 2021-08-31 DIAGNOSIS — R7309 Other abnormal glucose: Secondary | ICD-10-CM | POA: Diagnosis not present

## 2021-10-01 DIAGNOSIS — R7309 Other abnormal glucose: Secondary | ICD-10-CM | POA: Diagnosis not present

## 2021-10-07 ENCOUNTER — Telehealth: Payer: Self-pay

## 2021-10-07 DIAGNOSIS — Z1211 Encounter for screening for malignant neoplasm of colon: Secondary | ICD-10-CM

## 2021-10-07 NOTE — Telephone Encounter (Signed)
LVM returning patients call to reschedule her 11/18/21 colonoscopy.  I will also send a mychart message.  Thanks, [place], [place]

## 2021-10-08 ENCOUNTER — Other Ambulatory Visit: Payer: Self-pay

## 2021-10-08 DIAGNOSIS — Z1211 Encounter for screening for malignant neoplasm of colon: Secondary | ICD-10-CM

## 2021-10-08 MED ORDER — NA SULFATE-K SULFATE-MG SULF 17.5-3.13-1.6 GM/177ML PO SOLN
1.0000 | Freq: Once | ORAL | 0 refills | Status: AC
Start: 2021-10-08 — End: 2021-10-08

## 2021-10-08 NOTE — Progress Notes (Signed)
Colonoscopy reschedule to 12/02/21.

## 2021-10-23 DIAGNOSIS — Z6829 Body mass index (BMI) 29.0-29.9, adult: Secondary | ICD-10-CM | POA: Diagnosis not present

## 2021-10-23 DIAGNOSIS — Z713 Dietary counseling and surveillance: Secondary | ICD-10-CM | POA: Diagnosis not present

## 2021-11-01 DIAGNOSIS — R7309 Other abnormal glucose: Secondary | ICD-10-CM | POA: Diagnosis not present

## 2021-11-25 ENCOUNTER — Telehealth: Payer: Self-pay | Admitting: Gastroenterology

## 2021-11-25 NOTE — Telephone Encounter (Signed)
Per pt would like to r/s colonoscopy for Jan 2024.

## 2021-11-26 ENCOUNTER — Other Ambulatory Visit: Payer: Self-pay

## 2021-11-26 DIAGNOSIS — Z1211 Encounter for screening for malignant neoplasm of colon: Secondary | ICD-10-CM

## 2021-11-26 NOTE — Telephone Encounter (Signed)
Colonoscopy has been rescheduled to 03/24/22.  Thanks,  Sharyn Lull CMA

## 2021-12-01 DIAGNOSIS — R7309 Other abnormal glucose: Secondary | ICD-10-CM | POA: Diagnosis not present

## 2021-12-02 ENCOUNTER — Ambulatory Visit: Admit: 2021-12-02 | Payer: BC Managed Care – PPO | Admitting: Gastroenterology

## 2021-12-02 SURGERY — COLONOSCOPY WITH PROPOFOL
Anesthesia: General

## 2021-12-24 IMAGING — RF DG CERVICAL SPINE 1V
1 series · 1 of 1 positions shown · non-contrast
Comparison: Cervical spine radiographs 02/14/2021

CLINICAL DATA: ACDF at C5-C6

EXAM:
DG CERVICAL SPINE - 1 VIEW

[Series 1: run · 1 of 1 slices shown]
[im 1/1]
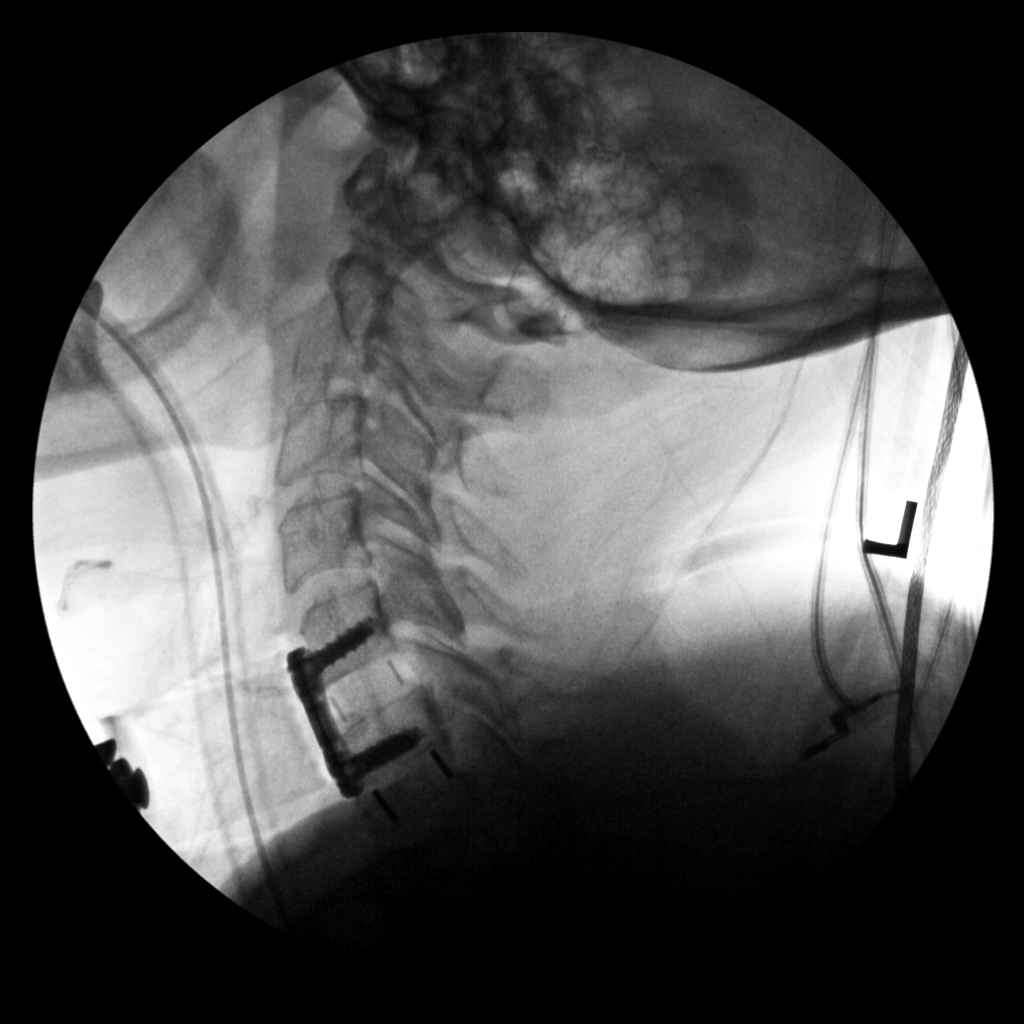

[1 of 1 positions shown; findings below may reference images not displayed]

FINDINGS: Single C-arm fluoroscopic image was obtained intraoperatively and
submitted for post operative interpretation. The cervical spine is
imaged through the C6 vertebral body. Previously seen anterior
fusion hardware at C6-C7 has been removed though the interbody
spacer remains in place. There are new postsurgical changes
reflecting C5-C6 ACDF. There is no evidence of immediate
complication. Fluoro time 5.3 seconds. Please see the performing
provider's procedural report for further detail.
IMPRESSION: Intraoperative fluoroscopy as above.

## 2022-01-01 DIAGNOSIS — R7309 Other abnormal glucose: Secondary | ICD-10-CM | POA: Diagnosis not present

## 2022-01-31 DIAGNOSIS — R7309 Other abnormal glucose: Secondary | ICD-10-CM | POA: Diagnosis not present

## 2022-02-03 DIAGNOSIS — F418 Other specified anxiety disorders: Secondary | ICD-10-CM | POA: Diagnosis not present

## 2022-02-03 DIAGNOSIS — Z6827 Body mass index (BMI) 27.0-27.9, adult: Secondary | ICD-10-CM | POA: Diagnosis not present

## 2022-02-03 DIAGNOSIS — Z1231 Encounter for screening mammogram for malignant neoplasm of breast: Secondary | ICD-10-CM | POA: Diagnosis not present

## 2022-02-03 DIAGNOSIS — Z01419 Encounter for gynecological examination (general) (routine) without abnormal findings: Secondary | ICD-10-CM | POA: Diagnosis not present

## 2022-02-03 DIAGNOSIS — I10 Essential (primary) hypertension: Secondary | ICD-10-CM | POA: Diagnosis not present

## 2022-02-03 DIAGNOSIS — N809 Endometriosis, unspecified: Secondary | ICD-10-CM | POA: Diagnosis not present

## 2022-02-03 LAB — HM MAMMOGRAPHY

## 2022-03-03 DIAGNOSIS — R7309 Other abnormal glucose: Secondary | ICD-10-CM | POA: Diagnosis not present

## 2022-03-20 ENCOUNTER — Encounter: Payer: Self-pay | Admitting: Family Medicine

## 2022-03-24 ENCOUNTER — Ambulatory Visit: Payer: BC Managed Care – PPO | Admitting: Anesthesiology

## 2022-03-24 ENCOUNTER — Encounter: Payer: Self-pay | Admitting: Gastroenterology

## 2022-03-24 ENCOUNTER — Encounter
Admission: RE | Disposition: A | Discharge status: Home / Self Care | Payer: Self-pay | Source: Home / Self Care | Attending: Gastroenterology

## 2022-03-24 ENCOUNTER — Other Ambulatory Visit: Payer: Self-pay

## 2022-03-24 ENCOUNTER — Ambulatory Visit
Admission: RE | Admit: 2022-03-24 | Discharge: 2022-03-24 | Disposition: A | Discharge status: Home / Self Care | Payer: BC Managed Care – PPO | Attending: Gastroenterology | Admitting: Gastroenterology

## 2022-03-24 DIAGNOSIS — I10 Essential (primary) hypertension: Secondary | ICD-10-CM | POA: Diagnosis not present

## 2022-03-24 DIAGNOSIS — Z8249 Family history of ischemic heart disease and other diseases of the circulatory system: Secondary | ICD-10-CM | POA: Diagnosis not present

## 2022-03-24 DIAGNOSIS — T7840XA Allergy, unspecified, initial encounter: Secondary | ICD-10-CM | POA: Diagnosis not present

## 2022-03-24 DIAGNOSIS — E785 Hyperlipidemia, unspecified: Secondary | ICD-10-CM | POA: Diagnosis not present

## 2022-03-24 DIAGNOSIS — Z1211 Encounter for screening for malignant neoplasm of colon: Secondary | ICD-10-CM

## 2022-03-24 DIAGNOSIS — K644 Residual hemorrhoidal skin tags: Secondary | ICD-10-CM | POA: Insufficient documentation

## 2022-03-24 DIAGNOSIS — Z79899 Other long term (current) drug therapy: Secondary | ICD-10-CM | POA: Insufficient documentation

## 2022-03-24 HISTORY — PX: COLONOSCOPY WITH PROPOFOL: SHX5780

## 2022-03-24 LAB — POCT PREGNANCY, URINE: Preg Test, Ur: NEGATIVE

## 2022-03-24 SURGERY — COLONOSCOPY WITH PROPOFOL
Anesthesia: General

## 2022-03-24 MED ORDER — PROPOFOL 10 MG/ML IV BOLUS
INTRAVENOUS | Status: DC | PRN
  Administered 2022-03-24: 30 mg via INTRAVENOUS
  Administered 2022-03-24: 70 mg via INTRAVENOUS

## 2022-03-24 MED ORDER — DEXMEDETOMIDINE HCL IN NACL 80 MCG/20ML IV SOLN
INTRAVENOUS | Status: DC | PRN
  Administered 2022-03-24 (×3): 4 ug via BUCCAL

## 2022-03-24 MED ORDER — LIDOCAINE 2% (20 MG/ML) 5 ML SYRINGE
INTRAMUSCULAR | Status: DC | PRN
  Administered 2022-03-24: 50 mg via INTRAVENOUS

## 2022-03-24 MED ORDER — PROPOFOL 500 MG/50ML IV EMUL
INTRAVENOUS | Status: DC | PRN
  Administered 2022-03-24: 140 ug/kg/min via INTRAVENOUS

## 2022-03-24 MED ORDER — PROPOFOL 1000 MG/100ML IV EMUL
INTRAVENOUS | Status: AC
  Filled 2022-03-24: qty 100

## 2022-03-24 MED ORDER — SODIUM CHLORIDE 0.9 % IV SOLN: INTRAVENOUS | Status: DC

## 2022-03-24 NOTE — H&P (Signed)
Cephas Darby, MD [address]  [place]  [place],  [address]  Main: [phone]  Fax: [phone] Pager: [phone]  Primary Care Physician:  Susy Frizzle, MD Primary Gastroenterologist:  Dr. Cephas Darby  Pre-Procedure History & Physical: HPI:  ASTRAEA GAUGHRAN is a 47 y.o. female is here for an colonoscopy.   Past Medical History:  Diagnosis Date   Benign essential HTN    HLD (hyperlipidemia)     Past Surgical History:  Procedure Laterality Date   ANTERIOR CERVICAL DECOMP/DISCECTOMY FUSION N/A 02/22/2021   Procedure: Cervical five-six Anterior Cervical Decompression Discectomy Fusion;  Surgeon: Eustace Moore, MD;  Location: [facility];  Service: Neurosurgery;  Laterality: N/A;   CESAREAN SECTION     LAPAROSCOPY     NECK SURGERY     ROTATOR CUFF REPAIR Right    Aug 2022    Prior to Admission medications   Medication Sig Start Date End Date Taking? Authorizing Provider  amLODipine (NORVASC) 5 MG tablet Take 1 tablet by mouth daily. 08/19/21  Yes Susy Frizzle, MD  buPROPion [facility] SR) 150 MG 12 hr tablet Take 1 tablet by mouth 2 (two) times daily. 03/21/17  Yes [provider]  buPROPion (WELLBUTRIN SR) 200 MG 12 hr tablet Take 200 mg by mouth 2 (two) times daily. 04/24/21  Yes [provider]  Cholecalciferol (VITAMIN D) 125 MCG (5000 UT) CAPS Take by mouth.   Yes [provider]  Cyanocobalamin (VITAMIN B 12) 500 MCG TABS Take by mouth.   Yes [provider]  fluticasone (FLONASE) 50 MCG/ACT nasal spray Place 2 sprays into both nostrils daily. 10/17/20  Yes Susy Frizzle, MD  metoprolol succinate (TOPROL-XL) 25 MG 24 hr tablet Take 1 tablet by mouth daily. 08/19/21  Yes Susy Frizzle, MD  norethindrone-ethinyl estradiol (MICROGESTIN,JUNEL,LOESTRIN) 1-20 MG-MCG tablet TAKE 1 TABLET BY MOUTH DAILY. ACTIVE TABLETS ONLY 12/08/14  Yes [provider]  vitamin C (ASCORBIC ACID) 500 MG tablet Take 500 mg  by mouth daily.   Yes [provider]  zinc gluconate 50 MG tablet Take 50 mg by mouth daily.   Yes [provider]  Melatonin 10 MG TABS     [provider]  meloxicam (MOBIC) 15 MG tablet Take 1 tablet (15 mg total) by mouth daily. 06/26/21   Hyatt, Max T, DPM  methocarbamol (ROBAXIN) 500 MG tablet Take 1 tablet (500 mg total) by mouth every 6 (six) hours as needed for muscle spasms. 02/22/21   Eustace Moore, MD  NOVOFINE PLUS 32G X 4 MM MISC USE 1 D FOR INJECTION WITH SAXENDA 12/21/18   [provider]  Semaglutide-Weight Management [facility]) 1 MG/0.5ML [place]  07/21/21   [provider]    Allergies as of 11/26/2021 - Review Complete 07/30/2021  Allergen Reaction Noted   Promethazine Other (See Comments) 01/29/2015   Acetaminophen Itching 06/26/2021   Augmentin [amoxicillin-pot clavulanate]  02/15/2018   Cefdinir Itching 05/11/2017   Erythromycin Hives 01/29/2015   Hydrocodone Itching 01/29/2015   Other  02/08/2018   Promethazine hcl Other (See Comments) 07/30/2021    Family History  Problem Relation Age of Onset   Osteoporosis Mother    Parkinson's disease Father    Heart disease Father     Social History   Socioeconomic History   Marital status: Married    Spouse name: Not on file   Number of children: Not on file   Years of education: Not on  file   Highest education level: Not on file  Occupational History   Not on file  Tobacco Use   Smoking status: Never   Smokeless tobacco: Never  Vaping Use   Vaping Use: Never used  Substance and Sexual Activity   Alcohol use: Yes    Alcohol/week: 0.0 standard drinks of alcohol    Comment: Social   Drug use: No   Sexual activity: Not on file  Other Topics Concern   Not on file  Social History Narrative   Not on file   Social Determinants of Health   Financial Resource Strain: Not on file  Food Insecurity: Not on file  Transportation Needs: Not on file  Physical Activity:  Not on file  Stress: Not on file  Social Connections: Not on file  Intimate Partner Violence: Not on file    Review of Systems: See HPI, otherwise negative ROS  Physical Exam: BP 116/64   Pulse 93   Temp (!) 97.1 F (36.2 C) (Temporal)   Resp 18   Ht 5\' 2"  (1.575 m)   Wt 64.4 kg   SpO2 100%   BMI 25.97 kg/m  General:   Alert,  pleasant and cooperative in NAD Head:  Normocephalic and atraumatic. Neck:  Supple; no masses or thyromegaly. Lungs:  Clear throughout to auscultation.    Heart:  Regular rate and rhythm. Abdomen:  Soft, nontender and nondistended. Normal bowel sounds, without guarding, and without rebound.   Neurologic:  Alert and  oriented x4;  grossly normal neurologically.  Impression/Plan: DINORAH MASULLO is here for an colonoscopy to be performed for colon cancer screening  Risks, benefits, limitations, and alternatives regarding  colonoscopy have been reviewed with the patient.  Questions have been answered.  All parties agreeable.   Sherri Sear, MD  03/24/2022, 7:41 AM

## 2022-03-24 NOTE — Anesthesia Preprocedure Evaluation (Signed)
Anesthesia Evaluation  Patient identified by MRN, date of birth, ID band Patient awake    Reviewed: Allergy & Precautions, NPO status , Patient's Chart, lab work & pertinent test results  Airway Mallampati: II  TM Distance: >3 FB Neck ROM: full    Dental  (+) Teeth Intact   Pulmonary neg pulmonary ROS   Pulmonary exam normal breath sounds clear to auscultation       Cardiovascular Exercise Tolerance: Good hypertension, Pt. on medications negative cardio ROS Normal cardiovascular exam Rhythm:Regular     Neuro/Psych negative neurological ROS  negative psych ROS   GI/Hepatic negative GI ROS, Neg liver ROS,,,  Endo/Other  negative endocrine ROS    Renal/GU negative Renal ROS  negative genitourinary   Musculoskeletal   Abdominal Normal abdominal exam  (+)   Peds negative pediatric ROS (+)  Hematology negative hematology ROS (+)   Anesthesia Other Findings Past Medical History: No date: Benign essential HTN No date: HLD (hyperlipidemia)  Past Surgical History: 02/22/2021: ANTERIOR CERVICAL DECOMP/DISCECTOMY FUSION; N/A     Comment:  Procedure: Cervical five-six Anterior Cervical               Decompression Discectomy Fusion;  Surgeon: Eustace Moore, MD;  Location: [place];  Service: Neurosurgery;                Laterality: N/A; No date: CESAREAN SECTION No date: LAPAROSCOPY No date: NECK SURGERY No date: ROTATOR CUFF REPAIR; Right     Comment:  Aug 2022     Reproductive/Obstetrics negative OB ROS                             Anesthesia Physical Anesthesia Plan  ASA: 2  Anesthesia Plan: General   Post-op Pain Management:    Induction: Intravenous  PONV Risk Score and Plan: Propofol infusion and TIVA  Airway Management Planned: Natural Airway  Additional Equipment:   Intra-op Plan:   Post-operative Plan:   Informed Consent: I have reviewed the patients  History and Physical, chart, labs and discussed the procedure including the risks, benefits and alternatives for the proposed anesthesia with the patient or authorized representative who has indicated his/her understanding and acceptance.     Dental Advisory Given  Plan Discussed with: CRNA and Surgeon  Anesthesia Plan Comments:        Anesthesia Quick Evaluation

## 2022-03-24 NOTE — Anesthesia Postprocedure Evaluation (Signed)
Anesthesia Post Note  Patient: Veronica Armstrong  Procedure(s) Performed: COLONOSCOPY WITH PROPOFOL  Patient location during evaluation: PACU Anesthesia Type: General Level of consciousness: awake and awake and alert Pain management: pain level controlled Vital Signs Assessment: post-procedure vital signs reviewed and stable Respiratory status: spontaneous breathing and nonlabored ventilation Cardiovascular status: stable Anesthetic complications: no  No notable events documented.   Last Vitals:  Vitals:   03/24/22 0820 03/24/22 0830  BP:  106/71  Pulse: 74 73  Resp: (!) 22 (!) 22  Temp:    SpO2: 100% 99%    Last Pain:  Vitals:   03/24/22 0816  TempSrc: Temporal  PainSc:                  VAN STAVEREN,Ioannis Schuh

## 2022-03-24 NOTE — Transfer of Care (Signed)
Immediate Anesthesia Transfer of Care Note  Patient: BONNEY BERRES  Procedure(s) Performed: COLONOSCOPY WITH PROPOFOL  Patient Location: Endoscopy Unit  Anesthesia Type:General  Level of Consciousness: sedated  Airway & Oxygen Therapy: Patient Spontanous Breathing  Post-op Assessment: Report given to RN and Post -op Vital signs reviewed and stable  Post vital signs: Reviewed and stable  Last Vitals:  Vitals Value Taken Time  BP 105/75 03/24/22 0814  Temp    Pulse 78 03/24/22 0814  Resp 20 03/24/22 0814  SpO2 99 % 03/24/22 0814  Vitals shown include unvalidated device data.  Last Pain:  Vitals:   03/24/22 0702  TempSrc: Temporal  PainSc: 0-No pain         Complications: No notable events documented.

## 2022-03-24 NOTE — Op Note (Signed)
[facility] Gastroenterology Patient Name: Veronica Armstrong Procedure Date: 03/24/2022 7:25 AM MRN: [id number] Account #: [id number] Date of Birth: 22-Apr-1975 Admit Type: Outpatient Age: 47 Room: [facility] ENDO ROOM 4 Gender: Female Note Status: Finalized Instrument Name: Colonoscope [id number] Procedure:             Colonoscopy Indications:           Screening for colorectal malignant neoplasm Providers:             Toney Reil MD, MD Referring MD:          Priscille Heidelberg. Pickard (Referring MD) Medicines:             General Anesthesia Complications:         No immediate complications. Estimated blood loss: None. Procedure:             Pre-Anesthesia Assessment:                        - Prior to the procedure, a History and Physical was                         performed, and patient medications and allergies were                         reviewed. The patient is competent. The risks and                         benefits of the procedure and the sedation options and                         risks were discussed with the patient. All questions                         were answered and informed consent was obtained.                         Patient identification and proposed procedure were                         verified by the physician, the nurse, the                         anesthesiologist, the anesthetist and the technician                         in the pre-procedure area in the procedure room in the                         endoscopy suite. Mental Status Examination: alert and                         oriented. Airway Examination: normal oropharyngeal                         airway and neck mobility. Respiratory Examination:                         clear to auscultation. CV Examination: normal.  Prophylactic Antibiotics: The patient does not require                         prophylactic antibiotics. Prior Anticoagulants: The                          patient has taken no anticoagulant or antiplatelet                         agents. ASA Grade Assessment: II - A patient with mild                         systemic disease. After reviewing the risks and                         benefits, the patient was deemed in satisfactory                         condition to undergo the procedure. The anesthesia                         plan was to use general anesthesia. Immediately prior                         to administration of medications, the patient was                         re-assessed for adequacy to receive sedatives. The                         heart rate, respiratory rate, oxygen saturations,                         blood pressure, adequacy of pulmonary ventilation, and                         response to care were monitored throughout the                         procedure. The physical status of the patient was                         re-assessed after the procedure.                        After obtaining informed consent, the colonoscope was                         passed under direct vision. Throughout the procedure,                         the patient's blood pressure, pulse, and oxygen                         saturations were monitored continuously. The                         Colonoscope was introduced through the anus and  advanced to the the terminal ileum, with                         identification of the appendiceal orifice and IC                         valve. The colonoscopy was performed without                         difficulty. The colonoscopy was technically difficult                         and complex due to a tortuous colon. Successful                         completion of the procedure was aided by withdrawing                         the scope and replacing with the pediatric                         colonoscope. The patient tolerated the procedure well.                         The quality of the  bowel preparation was evaluated                         using the BBPS [facility] Bowel Preparation Scale) with                         scores of: Right Colon = 3, Transverse Colon = 3 and                         Left Colon = 3 (entire mucosa seen well with no                         residual staining, small fragments of stool or opaque                         liquid). The total BBPS score equals 9. The terminal                         ileum, ileocecal valve, appendiceal orifice, and                         rectum were photographed. Findings:      Skin tags were found on perianal exam.      The terminal ileum appeared normal.      The entire examined colon appeared normal.      The retroflexed view of the distal rectum and anal verge was normal and       showed no anal or rectal abnormalities. Impression:            - Perianal skin tags found on perianal exam.                        - The examined portion of the ileum was normal.                        -  The entire examined colon is normal.                        - The distal rectum and anal verge are normal on                         retroflexion view.                        - No specimens collected. Recommendation:        - Discharge patient to home (with escort).                        - Resume previous diet today.                        - Continue present medications.                        - Repeat colonoscopy in 10 years for screening                         purposes. Procedure Code(s):     --- Professional ---                        Q0347, Colorectal cancer screening; colonoscopy on                         individual not meeting criteria for high risk Diagnosis Code(s):     --- Professional ---                        Z12.11, Encounter for screening for malignant neoplasm                         of colon                        K64.4, Residual hemorrhoidal skin tags CPT copyright 2022 American Medical Association. All rights  reserved. The codes documented in this report are preliminary and upon coder review may  be revised to meet current compliance requirements. Dr. Ulyess Mort Lin Landsman MD, MD 03/24/2022 8:11:22 AM This report has been signed electronically. Number of Addenda: 0 Note Initiated On: 03/24/2022 7:25 AM Scope Withdrawal Time: 0 hours 7 minutes 23 seconds  Total Procedure Duration: 0 hours 16 minutes 40 seconds  Estimated Blood Loss:  Estimated blood loss: none.      [facility]

## 2022-03-25 ENCOUNTER — Encounter: Payer: Self-pay | Admitting: Gastroenterology

## 2022-04-03 DIAGNOSIS — R7309 Other abnormal glucose: Secondary | ICD-10-CM | POA: Diagnosis not present

## 2022-05-02 DIAGNOSIS — R7309 Other abnormal glucose: Secondary | ICD-10-CM | POA: Diagnosis not present

## 2022-05-07 DIAGNOSIS — Z6827 Body mass index (BMI) 27.0-27.9, adult: Secondary | ICD-10-CM | POA: Diagnosis not present

## 2022-05-07 DIAGNOSIS — Z713 Dietary counseling and surveillance: Secondary | ICD-10-CM | POA: Diagnosis not present

## 2022-05-07 DIAGNOSIS — I10 Essential (primary) hypertension: Secondary | ICD-10-CM | POA: Diagnosis not present

## 2022-06-02 DIAGNOSIS — R7309 Other abnormal glucose: Secondary | ICD-10-CM | POA: Diagnosis not present

## 2022-07-02 DIAGNOSIS — R7309 Other abnormal glucose: Secondary | ICD-10-CM | POA: Diagnosis not present

## 2022-07-17 ENCOUNTER — Other Ambulatory Visit: Payer: Self-pay | Admitting: Family Medicine

## 2022-07-17 DIAGNOSIS — I10 Essential (primary) hypertension: Secondary | ICD-10-CM

## 2022-08-02 DIAGNOSIS — R7309 Other abnormal glucose: Secondary | ICD-10-CM | POA: Diagnosis not present

## 2022-09-01 DIAGNOSIS — R7309 Other abnormal glucose: Secondary | ICD-10-CM | POA: Diagnosis not present

## 2022-09-16 DIAGNOSIS — Z713 Dietary counseling and surveillance: Secondary | ICD-10-CM | POA: Diagnosis not present

## 2022-09-16 DIAGNOSIS — I10 Essential (primary) hypertension: Secondary | ICD-10-CM | POA: Diagnosis not present

## 2022-09-16 DIAGNOSIS — Z6824 Body mass index (BMI) 24.0-24.9, adult: Secondary | ICD-10-CM | POA: Diagnosis not present

## 2022-09-16 DIAGNOSIS — Z8639 Personal history of other endocrine, nutritional and metabolic disease: Secondary | ICD-10-CM | POA: Diagnosis not present

## 2022-10-02 DIAGNOSIS — R7309 Other abnormal glucose: Secondary | ICD-10-CM | POA: Diagnosis not present

## 2022-11-02 DIAGNOSIS — R7309 Other abnormal glucose: Secondary | ICD-10-CM | POA: Diagnosis not present

## 2022-11-24 DIAGNOSIS — M9905 Segmental and somatic dysfunction of pelvic region: Secondary | ICD-10-CM | POA: Diagnosis not present

## 2022-11-24 DIAGNOSIS — M5417 Radiculopathy, lumbosacral region: Secondary | ICD-10-CM | POA: Diagnosis not present

## 2022-11-24 DIAGNOSIS — M9904 Segmental and somatic dysfunction of sacral region: Secondary | ICD-10-CM | POA: Diagnosis not present

## 2022-11-24 DIAGNOSIS — M9903 Segmental and somatic dysfunction of lumbar region: Secondary | ICD-10-CM | POA: Diagnosis not present

## 2022-12-02 DIAGNOSIS — R7309 Other abnormal glucose: Secondary | ICD-10-CM | POA: Diagnosis not present

## 2022-12-04 DIAGNOSIS — S32050A Wedge compression fracture of fifth lumbar vertebra, initial encounter for closed fracture: Secondary | ICD-10-CM | POA: Diagnosis not present

## 2022-12-16 DIAGNOSIS — M48061 Spinal stenosis, lumbar region without neurogenic claudication: Secondary | ICD-10-CM | POA: Diagnosis not present

## 2022-12-16 DIAGNOSIS — M51369 Other intervertebral disc degeneration, lumbar region without mention of lumbar back pain or lower extremity pain: Secondary | ICD-10-CM | POA: Diagnosis not present

## 2022-12-16 DIAGNOSIS — S32050A Wedge compression fracture of fifth lumbar vertebra, initial encounter for closed fracture: Secondary | ICD-10-CM | POA: Diagnosis not present

## 2022-12-16 DIAGNOSIS — M4856XA Collapsed vertebra, not elsewhere classified, lumbar region, initial encounter for fracture: Secondary | ICD-10-CM | POA: Diagnosis not present

## 2022-12-16 DIAGNOSIS — R29890 Loss of height: Secondary | ICD-10-CM | POA: Diagnosis not present

## 2022-12-25 ENCOUNTER — Ambulatory Visit (INDEPENDENT_AMBULATORY_CARE_PROVIDER_SITE_OTHER): Payer: BC Managed Care – PPO | Admitting: Family Medicine

## 2022-12-25 ENCOUNTER — Encounter: Payer: Self-pay | Admitting: Family Medicine

## 2022-12-25 VITALS — BP 120/72 | HR 65 | Temp 98.2°F | Ht 62.0 in | Wt 140.0 lb

## 2022-12-25 DIAGNOSIS — I10 Essential (primary) hypertension: Secondary | ICD-10-CM

## 2022-12-25 DIAGNOSIS — S32050A Wedge compression fracture of fifth lumbar vertebra, initial encounter for closed fracture: Secondary | ICD-10-CM | POA: Insufficient documentation

## 2022-12-25 DIAGNOSIS — E7800 Pure hypercholesterolemia, unspecified: Secondary | ICD-10-CM | POA: Diagnosis not present

## 2022-12-25 DIAGNOSIS — Z0001 Encounter for general adult medical examination with abnormal findings: Secondary | ICD-10-CM

## 2022-12-25 DIAGNOSIS — M8008XD Age-related osteoporosis with current pathological fracture, vertebra(e), subsequent encounter for fracture with routine healing: Secondary | ICD-10-CM

## 2022-12-25 MED ORDER — AMLODIPINE BESYLATE 5 MG PO TABS: 5.0000 mg | ORAL_TABLET | Freq: Every day | ORAL | 3 refills | Status: DC

## 2022-12-25 MED ORDER — METOPROLOL SUCCINATE ER 25 MG PO TB24: 25.0000 mg | ORAL_TABLET | Freq: Every day | ORAL | 3 refills | Status: DC

## 2022-12-25 NOTE — Progress Notes (Signed)
Subjective:    Patient ID: Veronica Armstrong, female    DOB: December 30, 1975, 47 y.o.   MRN: [id number]  HPI Patient is a very sweet 47 year old Caucasian female here today for a complete physical exam.  She received her flu shot at work.  She declines a COVID vaccination.  Colonoscopy was performed earlier this year.  Her mammogram and her Pap smear are performed at her gynecologist but are up-to-date.  Her blood pressure today is outstanding at 120/72.  Recently she suffered a vertebral fracture of L5.  This was due to lifting heavy weight.  She has a bone scan scheduled for next week to determine if she has osteoporosis.  This runs in her family as her mother has osteoporosis.  She is currently on vitamin D. Past Medical History:  Diagnosis Date   Benign essential HTN    HLD (hyperlipidemia)    Past Surgical History:  Procedure Laterality Date   ANTERIOR CERVICAL DECOMP/DISCECTOMY FUSION N/A 02/22/2021   Procedure: Cervical five-six Anterior Cervical Decompression Discectomy Fusion;  Surgeon: Tia Alert, MD;  Location: [facility] OR;  Service: Neurosurgery;  Laterality: N/A;   CESAREAN SECTION     COLONOSCOPY WITH PROPOFOL N/A 03/24/2022   Procedure: COLONOSCOPY WITH PROPOFOL;  Surgeon: Toney Reil, MD;  Location: [facility] ENDOSCOPY;  Service: Gastroenterology;  Laterality: N/A;   LAPAROSCOPY     NECK SURGERY     ROTATOR CUFF REPAIR Right    Aug 2022   Current Outpatient Medications on File Prior to Visit  Medication Sig Dispense Refill   buPROPion (WELLBUTRIN SR) 200 MG 12 hr tablet Take 200 mg by mouth 2 (two) times daily.     Cholecalciferol (VITAMIN D) 125 MCG (5000 UT) CAPS Take by mouth.     Cyanocobalamin (VITAMIN B 12) 500 MCG TABS Take by mouth.     Drospirenone (SLYND) 4 MG TABS Take 4 mg by mouth daily.     fluticasone (FLONASE) 50 MCG/ACT nasal spray Place 2 sprays into both nostrils daily. 48 g 3   Melatonin 10 MG TABS      methocarbamol (ROBAXIN) 500 MG tablet Take 1 tablet  (500 mg total) by mouth every 6 (six) hours as needed for muscle spasms. 60 tablet 1   NOVOFINE PLUS 32G X 4 MM MISC USE 1 D FOR INJECTION WITH SAXENDA     Semaglutide-Weight Management (WEGOVY) 1 MG/0.5ML SOAJ      vitamin C (ASCORBIC ACID) 500 MG tablet Take 500 mg by mouth daily.     zinc gluconate 50 MG tablet Take 50 mg by mouth daily.     No current facility-administered medications on file prior to visit.   Allergies  Allergen Reactions   Promethazine Other (See Comments)    Seizure   Augmentin [Amoxicillin-Pot Clavulanate]     Diarrhea   Cefdinir Itching   Erythromycin Hives   Hydrocodone Itching   Other     Pineapple , hives   Promethazine Hcl Other (See Comments)   Social History   Socioeconomic History   Marital status: Married    Spouse name: Not on file   Number of children: Not on file   Years of education: Not on file   Highest education level: Not on file  Occupational History   Not on file  Tobacco Use   Smoking status: Never   Smokeless tobacco: Never  Vaping Use   Vaping status: Never Used  Substance and Sexual Activity   Alcohol use: Yes  Alcohol/week: 0.0 standard drinks of alcohol    Comment: Social   Drug use: No   Sexual activity: Not on file  Other Topics Concern   Not on file  Social History Narrative   Not on file   Social Determinants of Health   Financial Resource Strain: Not on file  Food Insecurity: Not on file  Transportation Needs: Not on file  Physical Activity: Not on file  Stress: Not on file  Social Connections: Not on file  Intimate Partner Violence: Not on file      Review of Systems     Objective:   Physical Exam Constitutional:      General: She is not in acute distress.    Appearance: Normal appearance. She is normal weight. She is not diaphoretic.  HENT:     Right Ear: Tympanic membrane and ear canal normal.     Left Ear: Tympanic membrane normal.     Nose: Nose normal. No congestion or rhinorrhea.      Mouth/Throat:     Mouth: Mucous membranes are moist.     Pharynx: No oropharyngeal exudate.  Eyes:     Extraocular Movements: Extraocular movements intact.     Conjunctiva/sclera: Conjunctivae normal.     Pupils: Pupils are equal, round, and reactive to light.  Neck:     Thyroid: No thyromegaly.     Vascular: No carotid bruit or JVD.  Cardiovascular:     Rate and Rhythm: Normal rate and regular rhythm.     Heart sounds: Normal heart sounds. No murmur heard.    No friction rub. No gallop.  Pulmonary:     Effort: Pulmonary effort is normal. No respiratory distress.     Breath sounds: Normal breath sounds. No stridor. No wheezing, rhonchi or rales.  Abdominal:     General: Abdomen is flat. Bowel sounds are normal.     Palpations: Abdomen is soft.  Musculoskeletal:        General: Normal range of motion.     Cervical back: Neck supple. No rigidity.     Right lower leg: No edema.     Left lower leg: No edema.  Lymphadenopathy:     Cervical: No cervical adenopathy.  Skin:    General: Skin is warm.     Coloration: Skin is not jaundiced or pale.     Findings: No bruising, erythema, lesion or rash.  Neurological:     General: No focal deficit present.     Mental Status: She is alert and oriented to person, place, and time. Mental status is at baseline.     Gait: Gait normal.     Deep Tendon Reflexes: Reflexes normal.  Psychiatric:        Mood and Affect: Mood normal.        Behavior: Behavior normal.        Thought Content: Thought content normal.        Judgment: Judgment normal.           Assessment & Plan:  Benign essential HTN - Plan: COMPLETE METABOLIC PANEL WITH GFR, CBC with Differential/Platelet, Lipid panel  Pure hypercholesterolemia - Plan: COMPLETE METABOLIC PANEL WITH GFR, CBC with Differential/Platelet, Lipid panel  Fracture of vertebra due to osteoporosis with routine healing, subsequent encounter - Plan: VITAMIN D 25 Hydroxy (Vit-D Deficiency, Fractures),  TSH  Essential hypertension - Plan: metoprolol succinate (TOPROL-XL) 25 MG 24 hr tablet Patient's mammogram, colonoscopy, and Pap smear are up-to-date.  Immunizations are up-to-date.  She declines  a COVID shot.  Blood pressure is outstanding.  I encouraged her to add calcium 1200 mg a day to vitamin D 1000 units a day.  Await the results of her bone density test.  If her T-score is -2 or worse I would consider starting the patient on Evenity as a bone builder followed by a bisphosphonate.  I will check a vitamin D level as well as a TSH.  Also check CBC CMP and lipid panel.  Refilled her metoprolol and her amlodipine.

## 2022-12-26 LAB — COMPLETE METABOLIC PANEL WITH GFR
AG Ratio: 2 (calc) (ref 1.0–2.5)
ALT: 16 U/L (ref 6–29)
AST: 14 U/L (ref 10–35)
Albumin: 4.7 g/dL (ref 3.6–5.1)
Alkaline phosphatase (APISO): 120 U/L (ref 31–125)
BUN: 16 mg/dL (ref 7–25)
CO2: 21 mmol/L (ref 20–32)
Calcium: 9.8 mg/dL (ref 8.6–10.2)
Chloride: 107 mmol/L (ref 98–110)
Creat: 0.85 mg/dL (ref 0.50–0.99)
Globulin: 2.3 g/dL (ref 1.9–3.7)
Glucose, Bld: 86 mg/dL (ref 65–99)
Potassium: 5.1 mmol/L (ref 3.5–5.3)
Sodium: 138 mmol/L (ref 135–146)
Total Bilirubin: 0.4 mg/dL (ref 0.2–1.2)
Total Protein: 7 g/dL (ref 6.1–8.1)
eGFR: 85 mL/min/{1.73_m2} (ref >=60)

## 2022-12-26 LAB — CBC WITH DIFFERENTIAL/PLATELET
Absolute Lymphocytes: 1934 {cells}/uL (ref 850–3900)
Absolute Monocytes: 800 {cells}/uL (ref 200–950)
Basophils Absolute: 47 {cells}/uL (ref 0–200)
Basophils Relative: 0.5 %
Eosinophils Absolute: 140 {cells}/uL (ref 15–500)
Eosinophils Relative: 1.5 %
HCT: 40.8 % (ref 35.0–45.0)
Hemoglobin: 13.4 g/dL (ref 11.7–15.5)
MCH: 29.9 pg (ref 27.0–33.0)
MCHC: 32.8 g/dL (ref 32.0–36.0)
MCV: 91.1 fL (ref 80.0–100.0)
MPV: 9.8 fL (ref 7.5–12.5)
Monocytes Relative: 8.6 %
Neutro Abs: 6380 {cells}/uL (ref 1500–7800)
Neutrophils Relative %: 68.6 %
Platelets: 280 10*3/uL (ref 140–400)
RBC: 4.48 10*6/uL (ref 3.80–5.10)
RDW: 11.8 % (ref 11.0–15.0)
Total Lymphocyte: 20.8 %
WBC: 9.3 10*3/uL (ref 3.8–10.8)

## 2022-12-26 LAB — LIPID PANEL
Cholesterol: 216 mg/dL — ABNORMAL HIGH (ref <200)
HDL: 72 mg/dL (ref >=50)
LDL Cholesterol (Calc): 122 mg/dL — ABNORMAL HIGH
Non-HDL Cholesterol (Calc): 144 mg/dL — ABNORMAL HIGH (ref <130)
Total CHOL/HDL Ratio: 3 (calc) (ref <5.0)
Triglycerides: 108 mg/dL (ref <150)

## 2022-12-26 LAB — VITAMIN D 25 HYDROXY (VIT D DEFICIENCY, FRACTURES): Vit D, 25-Hydroxy: 31 ng/mL (ref 30–100)

## 2022-12-26 LAB — TSH: TSH: 1.79 m[IU]/L

## 2022-12-30 DIAGNOSIS — Z6825 Body mass index (BMI) 25.0-25.9, adult: Secondary | ICD-10-CM | POA: Diagnosis not present

## 2022-12-30 DIAGNOSIS — S32050A Wedge compression fracture of fifth lumbar vertebra, initial encounter for closed fracture: Secondary | ICD-10-CM | POA: Diagnosis not present

## 2023-01-02 DIAGNOSIS — Z6824 Body mass index (BMI) 24.0-24.9, adult: Secondary | ICD-10-CM | POA: Diagnosis not present

## 2023-01-02 DIAGNOSIS — Z713 Dietary counseling and surveillance: Secondary | ICD-10-CM | POA: Diagnosis not present

## 2023-01-02 DIAGNOSIS — Z8639 Personal history of other endocrine, nutritional and metabolic disease: Secondary | ICD-10-CM | POA: Diagnosis not present

## 2023-01-02 DIAGNOSIS — R7309 Other abnormal glucose: Secondary | ICD-10-CM | POA: Diagnosis not present

## 2023-01-06 DIAGNOSIS — S32050A Wedge compression fracture of fifth lumbar vertebra, initial encounter for closed fracture: Secondary | ICD-10-CM | POA: Diagnosis not present

## 2023-01-06 DIAGNOSIS — M8588 Other specified disorders of bone density and structure, other site: Secondary | ICD-10-CM | POA: Diagnosis not present

## 2023-01-06 DIAGNOSIS — N958 Other specified menopausal and perimenopausal disorders: Secondary | ICD-10-CM | POA: Diagnosis not present

## 2023-01-06 LAB — HM DEXA SCAN

## 2023-01-27 DIAGNOSIS — Z6825 Body mass index (BMI) 25.0-25.9, adult: Secondary | ICD-10-CM | POA: Diagnosis not present

## 2023-01-27 DIAGNOSIS — S32050A Wedge compression fracture of fifth lumbar vertebra, initial encounter for closed fracture: Secondary | ICD-10-CM | POA: Diagnosis not present

## 2023-01-28 ENCOUNTER — Other Ambulatory Visit (HOSPITAL_COMMUNITY): Payer: Self-pay | Admitting: Student

## 2023-01-28 DIAGNOSIS — S32050A Wedge compression fracture of fifth lumbar vertebra, initial encounter for closed fracture: Secondary | ICD-10-CM

## 2023-02-01 DIAGNOSIS — R7309 Other abnormal glucose: Secondary | ICD-10-CM | POA: Diagnosis not present

## 2023-02-05 DIAGNOSIS — N809 Endometriosis, unspecified: Secondary | ICD-10-CM | POA: Diagnosis not present

## 2023-02-05 DIAGNOSIS — Z01411 Encounter for gynecological examination (general) (routine) with abnormal findings: Secondary | ICD-10-CM | POA: Diagnosis not present

## 2023-02-05 DIAGNOSIS — Z1331 Encounter for screening for depression: Secondary | ICD-10-CM | POA: Diagnosis not present

## 2023-02-05 DIAGNOSIS — Z3041 Encounter for surveillance of contraceptive pills: Secondary | ICD-10-CM | POA: Diagnosis not present

## 2023-02-05 DIAGNOSIS — Z1231 Encounter for screening mammogram for malignant neoplasm of breast: Secondary | ICD-10-CM | POA: Diagnosis not present

## 2023-02-05 LAB — HM MAMMOGRAPHY

## 2023-02-09 ENCOUNTER — Ambulatory Visit (HOSPITAL_COMMUNITY)
Admission: RE | Admit: 2023-02-09 | Discharge: 2023-02-09 | Disposition: A | Discharge status: Home / Self Care | Payer: BC Managed Care – PPO | Source: Ambulatory Visit | Attending: Student | Admitting: Student

## 2023-02-09 DIAGNOSIS — S32050A Wedge compression fracture of fifth lumbar vertebra, initial encounter for closed fracture: Secondary | ICD-10-CM | POA: Insufficient documentation

## 2023-02-09 DIAGNOSIS — M5126 Other intervertebral disc displacement, lumbar region: Secondary | ICD-10-CM | POA: Diagnosis not present

## 2023-02-09 DIAGNOSIS — M48061 Spinal stenosis, lumbar region without neurogenic claudication: Secondary | ICD-10-CM | POA: Diagnosis not present

## 2023-02-09 DIAGNOSIS — R29890 Loss of height: Secondary | ICD-10-CM | POA: Diagnosis not present

## 2023-02-09 DIAGNOSIS — M4856XA Collapsed vertebra, not elsewhere classified, lumbar region, initial encounter for fracture: Secondary | ICD-10-CM | POA: Diagnosis not present

## 2023-02-19 DIAGNOSIS — S32050A Wedge compression fracture of fifth lumbar vertebra, initial encounter for closed fracture: Secondary | ICD-10-CM | POA: Diagnosis not present

## 2023-02-19 DIAGNOSIS — Z6825 Body mass index (BMI) 25.0-25.9, adult: Secondary | ICD-10-CM | POA: Diagnosis not present

## 2023-03-04 DIAGNOSIS — R7309 Other abnormal glucose: Secondary | ICD-10-CM | POA: Diagnosis not present

## 2023-03-06 ENCOUNTER — Telehealth: Payer: Self-pay | Admitting: Family Medicine

## 2023-03-06 DIAGNOSIS — I10 Essential (primary) hypertension: Secondary | ICD-10-CM

## 2023-03-06 NOTE — Telephone Encounter (Signed)
 Prescription Request  03/06/2023  LOV: 12/25/2022  What is the name of the medication or equipment? amLODipine  (NORVASC ) 5 MG tablet  and metoprolol  succ er tab  Have you contacted your pharmacy to request a refill? Yes   Which pharmacy would you like this sent to?  [facility] - [place], [place] - [address] [place] [address] Phone: [phone] Fax: [phone]    Patient notified that their request is being sent to the clinical staff for review and that they should receive a response within 2 business days.   Please advise at [facility] [phone]

## 2023-03-10 MED ORDER — AMLODIPINE BESYLATE 5 MG PO TABS: 5.0000 mg | ORAL_TABLET | Freq: Every day | ORAL | 2 refills | Status: DC

## 2023-03-10 MED ORDER — METOPROLOL SUCCINATE ER 25 MG PO TB24: 25.0000 mg | ORAL_TABLET | Freq: Every day | ORAL | 2 refills | Status: DC

## 2023-03-10 NOTE — Telephone Encounter (Signed)
 Change of pharmacy  Requested Prescriptions  Pending Prescriptions Disp Refills   amLODipine  (NORVASC ) 5 MG tablet 90 tablet 3    Sig: Take 1 tablet (5 mg total) by mouth daily.     Cardiovascular: Calcium Channel Blockers 2 Failed - 03/10/2023  9:59 AM      Failed - Valid encounter within last 6 months    Recent Outpatient Visits           1 year ago Colon cancer screening   [facility] Duanne Butler DASEN, MD   2 years ago Benign essential HTN   [facility] Duanne, Butler DASEN, MD   3 years ago Benign essential HTN   [facility] Duanne, Butler DASEN, MD   5 years ago Chronic sinusitis, unspecified location   [facility] Duanne, Butler DASEN, MD   5 years ago Acute non-recurrent sinusitis, unspecified location   [facility] Melvenia Ronal NOVAK, PA-C       Future Appointments             In 9 months Pickard, Butler DASEN, MD [facility]            Passed - Last BP in normal range    BP Readings from Last 1 Encounters:  12/25/22 120/72         Passed - Last Heart Rate in normal range    Pulse Readings from Last 1 Encounters:  12/25/22 65          metoprolol  succinate (TOPROL -XL) 25 MG 24 hr tablet 90 tablet 3    Sig: Take 1 tablet (25 mg total) by mouth daily.     Cardiovascular:  Beta Blockers Failed - 03/10/2023  9:59 AM      Failed - Valid encounter within last 6 months    Recent Outpatient Visits           1 year ago Colon cancer screening   [facility] Duanne Butler DASEN, MD   2 years ago Benign essential HTN   [facility] Duanne, Butler DASEN, MD   3 years ago Benign essential HTN   [facility] Duanne, Butler DASEN, MD   5 years ago Chronic sinusitis, unspecified location   [facility] Pickard, Butler DASEN, MD   5 years ago Acute non-recurrent sinusitis, unspecified location   [facility]  Medicine Melvenia Ronal NOVAK, PA-C       Future Appointments             In 9 months Pickard, Butler DASEN, MD [facility]            Passed - Last BP in normal range    BP Readings from Last 1 Encounters:  12/25/22 120/72         Passed - Last Heart Rate in normal range    Pulse Readings from Last 1 Encounters:  12/25/22 65

## 2023-03-12 ENCOUNTER — Telehealth: Payer: BC Managed Care – PPO | Admitting: Physician Assistant

## 2023-03-12 DIAGNOSIS — H6991 Unspecified Eustachian tube disorder, right ear: Secondary | ICD-10-CM | POA: Diagnosis not present

## 2023-03-12 DIAGNOSIS — H9201 Otalgia, right ear: Secondary | ICD-10-CM | POA: Diagnosis not present

## 2023-03-12 MED ORDER — FLUTICASONE PROPIONATE 50 MCG/ACT NA SUSP: 2.0000 | Freq: Every day | NASAL | 0 refills | Status: AC

## 2023-03-12 MED ORDER — DOXYCYCLINE HYCLATE 100 MG PO TABS: 100.0000 mg | ORAL_TABLET | Freq: Two times a day (BID) | ORAL | 0 refills | Status: AC

## 2023-03-12 NOTE — Progress Notes (Signed)
 I have spent 5 minutes in review of e-visit questionnaire, review and updating patient chart, medical decision making and response to patient.   Piedad Climes, PA-C

## 2023-03-12 NOTE — Progress Notes (Signed)
 E-Visit for Ear Pain - Eustachian Tube Dysfunction   We are sorry that you are not feeling well. Here is how we plan to help!  Based on what you have shared with me it looks like you have Eustachian Tube Dysfunction.  Eustachian Tube Dysfunction is a condition where the tubes that connect your middle ears to your upper throat become blocked. This can lead to discomfort, hearing difficulties and a feeling of fullness in your ear. Eustachian tube dysfunction usually resolves itself in a few days. The usual symptoms include: Hearing problems Tinnitus, or ringing in your ears Clicking or popping sounds A feeling of fullness in your ears Pain that mimics an ear infection Dizziness, vertigo or balance problems A "tickling" sensation in your ears  ?Eustachian tube dysfunction symptoms may get worse in higher altitudes. This is called barotrauma, and it can happen while scuba diving, flying in an airplane or driving in the mountains.   What causes eustachian tube dysfunction? Allergies and infections (like the common cold and the flu) are the most common causes of eustachian tube dysfunction. These conditions can cause inflammation and mucus buildup, leading to blockage. GERD, or chronic acid reflux, can also cause ETD. This is because stomach acid can back up into your throat and result in inflammation. As mentioned above, altitude changes can also cause ETD.   What are some common eustachian tube dysfunction treatments? In most cases, treatment isn't necessary because ETD often resolves on its own. However, you might need treatment if your symptoms linger for more than two weeks.    Eustachian tube dysfunction treatment depends on the cause and the severity of your condition. Treatments may include home remedies, medications or, in severe cases, surgery.     HOME CARE: Sometimes simple home remedies can help with mild cases of eustachian tube dysfunction. To try and clear the blockage, you  can: Chew gum. Yawn. Swallow. Try the Valsalva maneuver (breathing out forcefully while closing your mouth and pinching your nostrils). Use a saline spray to clear out nasal passages.  MEDICATIONS: Over-the-counter medications can help if allergies are causing eustachian tube dysfunction. Try antihistamines (like cetirizine or diphenhydramine) to ease your symptoms. If you have discomfort, pain relievers -- such as acetaminophen or ibuprofen -- can help.  Sometimes intranasal glucocorticosteroids (like Flonase  or Nasacort) help.  I have prescribed Fluticasone  50 mcg/spray 2 sprays in each nostril daily for 10-14 days  I am concerned for the start of a middle ear infection as well, so I am adding on an antibiotic -- Doxycycline -- to take as directed.    GET HELP RIGHT AWAY IF: Fever is over 102.2 degrees. You develop progressive ear pain or hearing loss. Ear symptoms persist longer than 3 days after treatment.  MAKE SURE YOU: Understand these instructions. Will watch your condition. Will get help right away if you are not doing well or get worse.  Thank you for choosing an e-visit.  Your e-visit answers were reviewed by a board certified advanced clinical practitioner to complete your personal care plan. Depending upon the condition, your plan could have included both over the counter or prescription medications.  Please review your pharmacy choice. Make sure the pharmacy is open so you can pick up the prescription now. If there is a problem, you may contact your provider through [employer] and have the prescription routed to another pharmacy.  Your safety is important to us . If you have drug allergies check your prescription carefully.   For the next  24 hours you can use MyChart to ask questions about today's visit, request a non-urgent call back, or ask for a work or school excuse. You will get an email with a survey after your eVisit asking about your experience. We would  appreciate your feedback. I hope that your e-visit has been valuable and will aid in your recovery.

## 2023-04-04 DIAGNOSIS — R7309 Other abnormal glucose: Secondary | ICD-10-CM | POA: Diagnosis not present

## 2023-05-02 DIAGNOSIS — R7309 Other abnormal glucose: Secondary | ICD-10-CM | POA: Diagnosis not present

## 2023-06-02 DIAGNOSIS — R7309 Other abnormal glucose: Secondary | ICD-10-CM | POA: Diagnosis not present

## 2023-06-17 DIAGNOSIS — I10 Essential (primary) hypertension: Secondary | ICD-10-CM | POA: Diagnosis not present

## 2023-06-17 DIAGNOSIS — Z8639 Personal history of other endocrine, nutritional and metabolic disease: Secondary | ICD-10-CM | POA: Diagnosis not present

## 2023-06-17 DIAGNOSIS — Z6824 Body mass index (BMI) 24.0-24.9, adult: Secondary | ICD-10-CM | POA: Diagnosis not present

## 2023-06-17 DIAGNOSIS — Z713 Dietary counseling and surveillance: Secondary | ICD-10-CM | POA: Diagnosis not present

## 2023-07-02 DIAGNOSIS — R7309 Other abnormal glucose: Secondary | ICD-10-CM | POA: Diagnosis not present

## 2023-07-31 ENCOUNTER — Telehealth: Payer: Self-pay

## 2023-07-31 NOTE — Telephone Encounter (Signed)
 Report from 01/06/23 showing Osteopenia. Report placed in green folder for you to review. Thank you.

## 2023-08-02 DIAGNOSIS — R7309 Other abnormal glucose: Secondary | ICD-10-CM | POA: Diagnosis not present

## 2023-09-01 DIAGNOSIS — R7309 Other abnormal glucose: Secondary | ICD-10-CM | POA: Diagnosis not present

## 2023-10-02 DIAGNOSIS — R7309 Other abnormal glucose: Secondary | ICD-10-CM | POA: Diagnosis not present

## 2023-11-02 DIAGNOSIS — R7309 Other abnormal glucose: Secondary | ICD-10-CM | POA: Diagnosis not present

## 2023-12-02 DIAGNOSIS — R7309 Other abnormal glucose: Secondary | ICD-10-CM | POA: Diagnosis not present

## 2023-12-04 ENCOUNTER — Other Ambulatory Visit: Payer: Self-pay | Admitting: Family Medicine

## 2023-12-04 NOTE — Telephone Encounter (Signed)
 Requested Prescriptions  Pending Prescriptions Disp Refills   amLODipine  (NORVASC ) 5 MG tablet [Pharmacy Med Name: amLODIPine  Besylate 5 MG Oral Tablet] 90 tablet 0    Sig: TAKE 1 TABLET BY MOUTH DAILY     Cardiovascular: Calcium Channel Blockers 2 Failed - 12/04/2023  4:12 PM      Failed - Valid encounter within last 6 months    Recent Outpatient Visits           11 months ago Benign essential HTN   [facility] Pickard, Butler DASEN, MD       Future Appointments             In 3 weeks Pickard, Butler DASEN, MD [place] [facility], [facility]            Passed - Last BP in normal range    BP Readings from Last 1 Encounters:  12/25/22 120/72         Passed - Last Heart Rate in normal range    Pulse Readings from Last 1 Encounters:  12/25/22 65

## 2023-12-28 ENCOUNTER — Encounter: Payer: BC Managed Care – PPO | Admitting: Family Medicine

## 2024-01-02 DIAGNOSIS — R7309 Other abnormal glucose: Secondary | ICD-10-CM | POA: Diagnosis not present

## 2024-02-10 DIAGNOSIS — Z124 Encounter for screening for malignant neoplasm of cervix: Secondary | ICD-10-CM | POA: Diagnosis not present

## 2024-02-10 DIAGNOSIS — N809 Endometriosis, unspecified: Secondary | ICD-10-CM | POA: Diagnosis not present

## 2024-02-10 DIAGNOSIS — Z0142 Encounter for cervical smear to confirm findings of recent normal smear following initial abnormal smear: Secondary | ICD-10-CM | POA: Diagnosis not present

## 2024-02-10 DIAGNOSIS — N952 Postmenopausal atrophic vaginitis: Secondary | ICD-10-CM | POA: Diagnosis not present

## 2024-02-10 DIAGNOSIS — Z01419 Encounter for gynecological examination (general) (routine) without abnormal findings: Secondary | ICD-10-CM | POA: Diagnosis not present

## 2024-02-10 DIAGNOSIS — Z1331 Encounter for screening for depression: Secondary | ICD-10-CM | POA: Diagnosis not present

## 2024-02-10 DIAGNOSIS — Z01411 Encounter for gynecological examination (general) (routine) with abnormal findings: Secondary | ICD-10-CM | POA: Diagnosis not present

## 2024-02-10 DIAGNOSIS — F418 Other specified anxiety disorders: Secondary | ICD-10-CM | POA: Diagnosis not present

## 2024-02-10 DIAGNOSIS — Z1231 Encounter for screening mammogram for malignant neoplasm of breast: Secondary | ICD-10-CM | POA: Diagnosis not present

## 2024-02-10 LAB — HM MAMMOGRAPHY

## 2024-02-12 ENCOUNTER — Other Ambulatory Visit: Payer: Self-pay | Admitting: Family Medicine

## 2024-02-12 DIAGNOSIS — I10 Essential (primary) hypertension: Secondary | ICD-10-CM

## 2024-02-17 LAB — HM PAP SMEAR: HPV, high-risk: NEGATIVE

## 2024-03-15 ENCOUNTER — Ambulatory Visit: Admitting: Family Medicine

## 2024-03-15 ENCOUNTER — Encounter: Payer: Self-pay | Admitting: Family Medicine

## 2024-03-15 VITALS — BP 120/78 | HR 81 | Temp 98.6°F | Ht 62.0 in | Wt 138.0 lb

## 2024-03-15 DIAGNOSIS — E7800 Pure hypercholesterolemia, unspecified: Secondary | ICD-10-CM

## 2024-03-15 DIAGNOSIS — I10 Essential (primary) hypertension: Secondary | ICD-10-CM | POA: Diagnosis not present

## 2024-03-15 LAB — CBC WITH DIFFERENTIAL/PLATELET
Absolute Lymphocytes: 2016 {cells}/uL (ref 850–3900)
Absolute Monocytes: 648 {cells}/uL (ref 200–950)
Basophils Absolute: 48 {cells}/uL (ref 0–200)
Basophils Relative: 0.6 %
Eosinophils Absolute: 112 {cells}/uL (ref 15–500)
Eosinophils Relative: 1.4 %
HCT: 44.1 % (ref 35.9–46.0)
Hemoglobin: 14.8 g/dL (ref 11.7–15.5)
MCH: 30.1 pg (ref 27.0–33.0)
MCHC: 33.6 g/dL (ref 31.6–35.4)
MCV: 89.8 fL (ref 81.4–101.7)
MPV: 9.2 fL (ref 7.5–12.5)
Monocytes Relative: 8.1 %
Neutro Abs: 5176 {cells}/uL (ref 1500–7800)
Neutrophils Relative %: 64.7 %
Platelets: 306 Thousand/uL (ref 140–400)
RBC: 4.91 Million/uL (ref 3.80–5.10)
RDW: 12.4 % (ref 11.0–15.0)
Total Lymphocyte: 25.2 %
WBC: 8 Thousand/uL (ref 3.8–10.8)

## 2024-03-15 LAB — COMPREHENSIVE METABOLIC PANEL WITH GFR
AG Ratio: 1.9 (calc) (ref 1.0–2.5)
ALT: 18 U/L (ref 6–29)
AST: 18 U/L (ref 10–35)
Albumin: 4.9 g/dL (ref 3.6–5.1)
Alkaline phosphatase (APISO): 105 U/L (ref 31–125)
BUN: 13 mg/dL (ref 7–25)
CO2: 27 mmol/L (ref 20–32)
Calcium: 10.4 mg/dL — ABNORMAL HIGH (ref 8.6–10.2)
Chloride: 101 mmol/L (ref 98–110)
Creat: 0.86 mg/dL (ref 0.50–0.99)
Globulin: 2.6 g/dL (ref 1.9–3.7)
Glucose, Bld: 82 mg/dL (ref 65–99)
Potassium: 5 mmol/L (ref 3.5–5.3)
Sodium: 138 mmol/L (ref 135–146)
Total Bilirubin: 0.3 mg/dL (ref 0.2–1.2)
Total Protein: 7.5 g/dL (ref 6.1–8.1)
eGFR: 83 mL/min/1.73m2

## 2024-03-15 LAB — LIPID PANEL
Cholesterol: 229 mg/dL — ABNORMAL HIGH
HDL: 85 mg/dL
LDL Cholesterol (Calc): 126 mg/dL — ABNORMAL HIGH
Non-HDL Cholesterol (Calc): 144 mg/dL — ABNORMAL HIGH
Total CHOL/HDL Ratio: 2.7 (calc)
Triglycerides: 81 mg/dL

## 2024-03-15 LAB — TSH: TSH: 1.49 m[IU]/L

## 2024-03-15 MED ORDER — MELOXICAM 15 MG PO TABS: 15.0000 mg | ORAL_TABLET | Freq: Every day | ORAL | 3 refills | Status: AC

## 2024-03-15 MED ORDER — METOPROLOL SUCCINATE ER 25 MG PO TB24: 25.0000 mg | ORAL_TABLET | Freq: Every day | ORAL | 3 refills | Status: AC

## 2024-03-15 MED ORDER — AMLODIPINE BESYLATE 5 MG PO TABS: 5.0000 mg | ORAL_TABLET | Freq: Every day | ORAL | 3 refills | Status: AC

## 2024-03-15 MED ORDER — CLONAZEPAM 0.5 MG PO TABS: 0.5000 mg | ORAL_TABLET | Freq: Three times a day (TID) | ORAL | 1 refills | Status: AC | PRN

## 2024-03-15 NOTE — Progress Notes (Signed)
 "  Subjective:    Patient ID: Veronica Armstrong, female    DOB: [birth year], 49 y.o.   MRN: [id number]  HPI Patient is a very sweet 49 year old Caucasian female here today for a checkup.  She declines a flu shot.  She declines a tetanus shot today.  Her Pap smear and her mammogram were performed by her gynecologist.  Her colonoscopy was performed in 2024 and is up-to-date.  Her mother recently passed away from renal cancer complicated by dementia.  Patient is having a difficult time as she was very close to her mother and have her father.  She is having to deal with the estate.  Her gynecologist started her on Lexapro approximately 1 month ago for anxiety and this seems to be helping although we discussed the situation and she would like Klonopin  that she could take at night occasionally just as needed for anxiety and insomnia.  She also reports symptoms of restless leg.  Occasionally at night her legs feel uncomfortable and ache and throb and this only goes away if she moves her legs.  She denies any neuropathic pain during the day.  Her blood pressure today is outstanding at 120/78 Past Medical History:  Diagnosis Date   Benign essential HTN    HLD (hyperlipidemia)    Past Surgical History:  Procedure Laterality Date   ANTERIOR CERVICAL DECOMP/DISCECTOMY FUSION N/A 02/22/2021   Procedure: Cervical five-six Anterior Cervical Decompression Discectomy Fusion;  Surgeon: Armstrong Alm RAMAN, MD;  Location: [facility] OR;  Service: Neurosurgery;  Laterality: N/A;   CESAREAN SECTION     COLONOSCOPY WITH PROPOFOL  N/A 03/24/2022   Procedure: COLONOSCOPY WITH PROPOFOL ;  Surgeon: Unk Corinn Skiff, MD;  Location: [facility] ENDOSCOPY;  Service: Gastroenterology;  Laterality: N/A;   LAPAROSCOPY     NECK SURGERY     ROTATOR CUFF REPAIR Right    Aug 2022   Current Outpatient Medications on File Prior to Visit  Medication Sig Dispense Refill   buPROPion (WELLBUTRIN SR) 200 MG 12 hr tablet Take 200 mg by mouth 2 (two) times  daily.     Cholecalciferol (VITAMIN D ) 125 MCG (5000 UT) CAPS Take by mouth.     Cyanocobalamin  (VITAMIN B 12) 500 MCG TABS Take by mouth.     Drospirenone (SLYND) 4 MG TABS Take 4 mg by mouth daily.     escitalopram (LEXAPRO) 5 MG tablet Take 5 mg by mouth daily.     fluticasone  (FLONASE ) 50 MCG/ACT nasal spray Place 2 sprays into both nostrils daily. 16 g 0   Melatonin 10 MG TABS      methocarbamol  (ROBAXIN ) 500 MG tablet Take 1 tablet (500 mg total) by mouth every 6 (six) hours as needed for muscle spasms. 60 tablet 1   NOVOFINE PLUS 32G X 4 MM MISC USE 1 D FOR INJECTION WITH SAXENDA     Semaglutide-Weight Management (WEGOVY) 1 MG/0.5ML SOAJ      vitamin C (ASCORBIC ACID) 500 MG tablet Take 500 mg by mouth daily.     zinc gluconate 50 MG tablet Take 50 mg by mouth daily.     doxycycline  (VIBRA -TABS) 100 MG tablet Take 1 tablet (100 mg total) by mouth 2 (two) times daily. (Patient not taking: Reported on 03/15/2024) 20 tablet 0   No current facility-administered medications on file prior to visit.   Allergies  Allergen Reactions   Promethazine Other (See Comments)    Seizure   Augmentin  [Amoxicillin -Pot Clavulanate]     Diarrhea   Cefdinir   Itching   Erythromycin Hives   Hydrocodone Itching   Other     Pineapple , hives   Promethazine Hcl Other (See Comments)   Social History   Socioeconomic History   Marital status: Married    Spouse name: Not on file   Number of children: Not on file   Years of education: Not on file   Highest education level: Bachelor's degree (e.g., BA, AB, BS)  Occupational History   Not on file  Tobacco Use   Smoking status: Never   Smokeless tobacco: Never  Vaping Use   Vaping status: Never Used  Substance and Sexual Activity   Alcohol use: Yes    Alcohol/week: 0.0 standard drinks of alcohol    Comment: Social   Drug use: No   Sexual activity: Not on file  Other Topics Concern   Not on file  Social History Narrative   Not on file   Social  Drivers of Health   Tobacco Use: Low Risk (03/15/2024)   Patient History    Smoking Tobacco Use: Never    Smokeless Tobacco Use: Never    Passive Exposure: Not on file  Financial Resource Strain: Low Risk (12/24/2023)   Overall Financial Resource Strain (CARDIA)    Difficulty of Paying Living Expenses: Not hard at all  Food Insecurity: No Food Insecurity (12/24/2023)   Epic    Worried About Radiation Protection Practitioner of Food in the Last Year: Never true    Ran Out of Food in the Last Year: Never true  Transportation Needs: No Transportation Needs (12/24/2023)   Epic    Lack of Transportation (Medical): No    Lack of Transportation (Non-Medical): No  Physical Activity: Insufficiently Active (12/24/2023)   Exercise Vital Sign    Days of Exercise per Week: 3 days    Minutes of Exercise per Session: 30 min  Stress: Stress Concern Present (12/24/2023)   [employer] of Occupational Health - Occupational Stress Questionnaire    Feeling of Stress: Very much  Social Connections: Moderately Isolated (12/24/2023)   Social Connection and Isolation Panel    Frequency of Communication with Friends and Family: More than three times a week    Frequency of Social Gatherings with Friends and Family: More than three times a week    Attends Religious Services: Patient declined    Active Member of Clubs or Organizations: No    Attends Engineer, Structural: Not on file    Marital Status: Married  Catering Manager Violence: Not on file  Depression (PHQ2-9): Medium Risk (12/25/2022)   Depression (PHQ2-9)    PHQ-2 Score: 5  Alcohol Screen: Low Risk (12/24/2023)   Alcohol Screen    Last Alcohol Screening Score (AUDIT): 3  Housing: Low Risk (12/24/2023)   Epic    Unable to Pay for Housing in the Last Year: No    Number of Times Moved in the Last Year: 0    Homeless in the Last Year: No  Utilities: Not on file  Health Literacy: Not on file      Review of Systems     Objective:   Physical  Exam Constitutional:      General: She is not in acute distress.    Appearance: Normal appearance. She is normal weight. She is not diaphoretic.  HENT:     Right Ear: Tympanic membrane and ear canal normal.     Left Ear: Tympanic membrane normal.     Nose: Nose normal. No congestion or rhinorrhea.  Mouth/Throat:     Mouth: Mucous membranes are moist.     Pharynx: No oropharyngeal exudate.  Eyes:     Extraocular Movements: Extraocular movements intact.     Conjunctiva/sclera: Conjunctivae normal.     Pupils: Pupils are equal, round, and reactive to light.  Neck:     Thyroid : No thyromegaly.     Vascular: No carotid bruit or JVD.  Cardiovascular:     Rate and Rhythm: Normal rate and regular rhythm.     Heart sounds: Normal heart sounds. No murmur heard.    No friction rub. No gallop.  Pulmonary:     Effort: Pulmonary effort is normal. No respiratory distress.     Breath sounds: Normal breath sounds. No stridor. No wheezing, rhonchi or rales.  Abdominal:     General: Abdomen is flat. Bowel sounds are normal.     Palpations: Abdomen is soft.  Musculoskeletal:        General: Normal range of motion.     Cervical back: Neck supple. No rigidity.     Right lower leg: No edema.     Left lower leg: No edema.  Lymphadenopathy:     Cervical: No cervical adenopathy.  Skin:    General: Skin is warm.     Coloration: Skin is not jaundiced or pale.     Findings: No bruising, erythema, lesion or rash.  Neurological:     General: No focal deficit present.     Mental Status: She is alert and oriented to person, place, and time. Mental status is at baseline.     Gait: Gait normal.     Deep Tendon Reflexes: Reflexes normal.  Psychiatric:        Mood and Affect: Mood normal.        Behavior: Behavior normal.        Thought Content: Thought content normal.        Judgment: Judgment normal.           Assessment & Plan:  Pure hypercholesterolemia - Plan: amLODipine  (NORVASC ) 5 MG  tablet, metoprolol  succinate (TOPROL -XL) 25 MG 24 hr tablet, Comprehensive metabolic panel with GFR, CBC with Differential/Platelet, Lipid panel, TSH  Essential hypertension - Plan: amLODipine  (NORVASC ) 5 MG tablet, metoprolol  succinate (TOPROL -XL) 25 MG 24 hr tablet, Comprehensive metabolic panel with GFR, CBC with Differential/Platelet, Lipid panel, TSH Patient's mammogram, colonoscopy, and Pap smear are up-to-date.  She declines flu and tetanus.  Blood pressure is outstanding.  Check CBC CMP and lipid panel and TSH.  Try iron supplement over-the-counter for restless leg.  If worsening she could try Mirapex.  I will give her Klonopin  to supplement her Lexapro.  She can take 1/2 mg tablet every 8 hours as needed for anxiety.  I encouraged her to use this sparingly to avoid habituation and dependency. "

## 2024-03-17 ENCOUNTER — Ambulatory Visit: Payer: Self-pay | Admitting: Family Medicine

## 2024-03-23 ENCOUNTER — Telehealth: Payer: Self-pay

## 2024-03-23 NOTE — Telephone Encounter (Signed)
 Fax sent to: Fogleman, KELLY [facility], [place] NPI: [id number] [address] [place] [address]   For copy of patient's recent pap smear and mammogram results. Mjp,lpn

## 2025-03-21 ENCOUNTER — Encounter: Admitting: Family Medicine
